# Patient Record
Sex: Female | Born: 1950 | Race: White | Hispanic: No | Smoking: Former smoker
Health system: Southern US, Community
[De-identification: ages and names within clinical notes are randomized; demographics above are authoritative.]

## PROBLEM LIST (undated history)

## (undated) DIAGNOSIS — C4492 Squamous cell carcinoma of skin, unspecified: Secondary | ICD-10-CM

## (undated) DIAGNOSIS — Z9889 Other specified postprocedural states: Secondary | ICD-10-CM

## (undated) DIAGNOSIS — R011 Cardiac murmur, unspecified: Secondary | ICD-10-CM

## (undated) DIAGNOSIS — R112 Nausea with vomiting, unspecified: Secondary | ICD-10-CM

## (undated) DIAGNOSIS — I5189 Other ill-defined heart diseases: Secondary | ICD-10-CM

## (undated) DIAGNOSIS — N950 Postmenopausal bleeding: Secondary | ICD-10-CM

## (undated) DIAGNOSIS — K219 Gastro-esophageal reflux disease without esophagitis: Secondary | ICD-10-CM

## (undated) DIAGNOSIS — Z7982 Long term (current) use of aspirin: Secondary | ICD-10-CM

## (undated) DIAGNOSIS — N8500 Endometrial hyperplasia, unspecified: Secondary | ICD-10-CM

## (undated) DIAGNOSIS — K7581 Nonalcoholic steatohepatitis (NASH): Secondary | ICD-10-CM

## (undated) DIAGNOSIS — E785 Hyperlipidemia, unspecified: Secondary | ICD-10-CM

## (undated) DIAGNOSIS — R0609 Other forms of dyspnea: Secondary | ICD-10-CM

## (undated) DIAGNOSIS — R0600 Dyspnea, unspecified: Secondary | ICD-10-CM

## (undated) DIAGNOSIS — F1290 Cannabis use, unspecified, uncomplicated: Secondary | ICD-10-CM

## (undated) DIAGNOSIS — B001 Herpesviral vesicular dermatitis: Secondary | ICD-10-CM

## (undated) DIAGNOSIS — T8859XA Other complications of anesthesia, initial encounter: Secondary | ICD-10-CM

## (undated) DIAGNOSIS — E669 Obesity, unspecified: Secondary | ICD-10-CM

## (undated) DIAGNOSIS — E119 Type 2 diabetes mellitus without complications: Secondary | ICD-10-CM

## (undated) DIAGNOSIS — I350 Nonrheumatic aortic (valve) stenosis: Secondary | ICD-10-CM

## (undated) DIAGNOSIS — M5030 Other cervical disc degeneration, unspecified cervical region: Secondary | ICD-10-CM

## (undated) DIAGNOSIS — K635 Polyp of colon: Secondary | ICD-10-CM

## (undated) DIAGNOSIS — C801 Malignant (primary) neoplasm, unspecified: Secondary | ICD-10-CM

## (undated) DIAGNOSIS — I10 Essential (primary) hypertension: Secondary | ICD-10-CM

## (undated) HISTORY — PX: COLONOSCOPY WITH PROPOFOL: SHX5780

## (undated) HISTORY — PX: CERVICAL FUSION: SHX112

## (undated) HISTORY — PX: TUBAL LIGATION: SHX77

## (undated) HISTORY — PX: MANDIBLE RECONSTRUCTION: SHX431

## (undated) HISTORY — PX: BREAST BIOPSY: SHX20

---

## 2004-02-12 ENCOUNTER — Ambulatory Visit: Payer: Self-pay | Admitting: Otolaryngology

## 2015-02-02 ENCOUNTER — Other Ambulatory Visit: Payer: Self-pay | Admitting: Pediatrics

## 2015-02-02 DIAGNOSIS — Z1231 Encounter for screening mammogram for malignant neoplasm of breast: Secondary | ICD-10-CM

## 2015-02-07 ENCOUNTER — Ambulatory Visit
Admission: RE | Admit: 2015-02-07 | Discharge: 2015-02-07 | Disposition: A | Discharge status: Home / Self Care | Payer: 59 | Source: Ambulatory Visit | Attending: Pediatrics | Admitting: Pediatrics

## 2015-02-07 DIAGNOSIS — Z1231 Encounter for screening mammogram for malignant neoplasm of breast: Secondary | ICD-10-CM

## 2016-02-12 ENCOUNTER — Other Ambulatory Visit: Payer: Self-pay | Admitting: Pediatrics

## 2016-02-12 DIAGNOSIS — Z1231 Encounter for screening mammogram for malignant neoplasm of breast: Secondary | ICD-10-CM

## 2016-02-13 ENCOUNTER — Ambulatory Visit
Admission: RE | Admit: 2016-02-13 | Discharge: 2016-02-13 | Disposition: A | Discharge status: Home / Self Care | Payer: 59 | Source: Ambulatory Visit | Attending: Pediatrics | Admitting: Pediatrics

## 2016-02-13 DIAGNOSIS — Z1231 Encounter for screening mammogram for malignant neoplasm of breast: Secondary | ICD-10-CM | POA: Insufficient documentation

## 2016-11-24 ENCOUNTER — Other Ambulatory Visit: Payer: Self-pay | Admitting: Pediatrics

## 2016-11-24 DIAGNOSIS — Z1231 Encounter for screening mammogram for malignant neoplasm of breast: Secondary | ICD-10-CM

## 2016-12-25 ENCOUNTER — Other Ambulatory Visit: Payer: Self-pay | Admitting: Pediatrics

## 2016-12-25 DIAGNOSIS — Z0000 Encounter for general adult medical examination without abnormal findings: Secondary | ICD-10-CM

## 2016-12-25 DIAGNOSIS — M899 Disorder of bone, unspecified: Secondary | ICD-10-CM

## 2017-02-19 ENCOUNTER — Ambulatory Visit
Admission: RE | Admit: 2017-02-19 | Discharge: 2017-02-19 | Disposition: A | Discharge status: Home / Self Care | Payer: Medicare Other | Source: Ambulatory Visit | Attending: Pediatrics | Admitting: Pediatrics

## 2017-02-19 DIAGNOSIS — Z1231 Encounter for screening mammogram for malignant neoplasm of breast: Secondary | ICD-10-CM | POA: Diagnosis not present

## 2018-01-18 ENCOUNTER — Other Ambulatory Visit: Payer: Self-pay | Admitting: Pediatrics

## 2018-01-18 DIAGNOSIS — Z1231 Encounter for screening mammogram for malignant neoplasm of breast: Secondary | ICD-10-CM

## 2018-02-25 ENCOUNTER — Ambulatory Visit
Admission: RE | Admit: 2018-02-25 | Discharge: 2018-02-25 | Disposition: A | Discharge status: Home / Self Care | Payer: Medicare Other | Source: Ambulatory Visit | Attending: Pediatrics | Admitting: Pediatrics

## 2018-02-25 DIAGNOSIS — Z1231 Encounter for screening mammogram for malignant neoplasm of breast: Secondary | ICD-10-CM | POA: Diagnosis not present

## 2018-04-14 ENCOUNTER — Other Ambulatory Visit: Payer: Self-pay | Admitting: Pediatrics

## 2018-04-14 DIAGNOSIS — Z780 Asymptomatic menopausal state: Secondary | ICD-10-CM

## 2018-06-10 ENCOUNTER — Other Ambulatory Visit: Payer: Medicare Other

## 2019-04-20 ENCOUNTER — Other Ambulatory Visit: Payer: Self-pay | Admitting: Pediatrics

## 2019-04-20 DIAGNOSIS — Z1231 Encounter for screening mammogram for malignant neoplasm of breast: Secondary | ICD-10-CM

## 2019-06-06 ENCOUNTER — Ambulatory Visit
Admission: RE | Admit: 2019-06-06 | Discharge: 2019-06-06 | Disposition: A | Discharge status: Home / Self Care | Payer: Medicare Other | Source: Ambulatory Visit | Attending: Pediatrics | Admitting: Pediatrics

## 2019-06-06 DIAGNOSIS — Z1231 Encounter for screening mammogram for malignant neoplasm of breast: Secondary | ICD-10-CM | POA: Diagnosis not present

## 2020-10-10 ENCOUNTER — Emergency Department
Admission: EM | Admit: 2020-10-10 | Discharge: 2020-10-10 | Disposition: A | Discharge status: Home / Self Care | Payer: Medicare Other | Attending: Emergency Medicine | Admitting: Emergency Medicine

## 2020-10-10 ENCOUNTER — Emergency Department: Payer: Medicare Other

## 2020-10-10 ENCOUNTER — Other Ambulatory Visit: Payer: Self-pay

## 2020-10-10 DIAGNOSIS — I10 Essential (primary) hypertension: Secondary | ICD-10-CM | POA: Insufficient documentation

## 2020-10-10 DIAGNOSIS — S0181XA Laceration without foreign body of other part of head, initial encounter: Secondary | ICD-10-CM

## 2020-10-10 DIAGNOSIS — Y9302 Activity, running: Secondary | ICD-10-CM | POA: Insufficient documentation

## 2020-10-10 DIAGNOSIS — S0990XA Unspecified injury of head, initial encounter: Secondary | ICD-10-CM

## 2020-10-10 DIAGNOSIS — Y92481 Parking lot as the place of occurrence of the external cause: Secondary | ICD-10-CM | POA: Insufficient documentation

## 2020-10-10 DIAGNOSIS — E119 Type 2 diabetes mellitus without complications: Secondary | ICD-10-CM | POA: Insufficient documentation

## 2020-10-10 DIAGNOSIS — W01198A Fall on same level from slipping, tripping and stumbling with subsequent striking against other object, initial encounter: Secondary | ICD-10-CM | POA: Insufficient documentation

## 2020-10-10 HISTORY — DX: Essential (primary) hypertension: I10

## 2020-10-10 HISTORY — DX: Type 2 diabetes mellitus without complications: E11.9

## 2020-10-10 MED ORDER — LIDOCAINE-EPINEPHRINE 2 %-1:100000 IJ SOLN
20.0000 mL | Freq: Once | INTRAMUSCULAR | Status: AC
  Administered 2020-10-10: 20 mL via INTRADERMAL

## 2020-10-10 NOTE — ED Triage Notes (Signed)
Pt to ED for fall after tripping while running after grandson who got loose in parking lot. Hit head on pavement. C/o "terrible" headache  Laceration noted to right forehead.  Denies blood thinner use.  Bleeding controlled.  Denies n/v

## 2020-10-10 NOTE — Discharge Instructions (Addendum)
Do not get the sutured area wet for 24 hours. After 24 hours, shower/bathe as usual and pat the area dry. Change the bandage 2 times per day and apply antibiotic ointment. Leave open to air when at no risk of getting the area dirty, but cover at night before bed. See your PCP or go to Urgent Care in 5 days for suture removal or sooner for signs or concern of infection.

## 2020-10-10 NOTE — ED Provider Notes (Signed)
[facility] Emergency Department Provider Note ____________________________________________   Event Date/Time   First MD Initiated Contact with Patient 10/10/20 2024     (approximate)  I have reviewed the triage vital signs and the nursing notes.   HISTORY  Chief Complaint Fall  HPI Courtney Sparks is a 70 y.o. female with history of diabetes and hypertension presents to the emergency department for treatment and evaluation of head injury and forehead laceration after mechanical, nonsyncopal fall.  [place] and her 23-year-old nephew pulled away from her and started running through the parking lot.  She took off running and tripped.  She fell hard onto the pavement.  She denies loss of consciousness.  Forehead laceration noted.  Bleeding is well controlled.  Tdap booster is current..         Past Medical History:  Diagnosis Date   Diabetes mellitus without complication ([place])    Hypertension     There are no problems to display for this patient.   Past Surgical History:  Procedure Laterality Date   BREAST BIOPSY Left 2010?   core bx-neg    Prior to Admission medications   Not on File    Allergies Patient has no allergy information on record.  Family History  Problem Relation Age of Onset   Breast cancer Neg Hx     Social History    Review of Systems  Constitutional: No fever/chills Eyes: No visual changes. ENT: No sore throat. Cardiovascular: Denies chest pain. Respiratory: Denies shortness of breath. Gastrointestinal: No abdominal pain.  No nausea, no vomiting.  No diarrhea.  No constipation. Genitourinary: Negative for dysuria. Musculoskeletal: Negative for back pain. Skin: Positive for laceration Neurological: Positive for headaches,  negative focal weakness or numbness. ____________________________________________   PHYSICAL EXAM:  VITAL SIGNS: ED Triage Vitals  Enc Vitals Group      BP 10/10/20 1749 (!) 190/78     Pulse Rate 10/10/20 1749 77     Resp 10/10/20 1749 20     Temp 10/10/20 1749 98.3 F (36.8 C)     Temp Source 10/10/20 1749 Oral     SpO2 10/10/20 1749 100 %     Weight 10/10/20 1750 226 lb (102.5 kg)     Height 10/10/20 1750 '5\' 9"'$  (1.753 m)     Head Circumference --      Peak Flow --      Pain Score 10/10/20 1755 9     Pain Loc --      Pain Edu? --      Excl. in [place]? --     Constitutional: Alert and oriented. Well appearing and in no acute distress. Eyes: Conjunctivae are normal. PERRL. EOMI. Head: Atraumatic. Nose: No congestion/rhinnorhea. Mouth/Throat: Mucous membranes are moist.  Oropharynx non-erythematous. Neck: No stridor.   Hematological/Lymphatic/Immunilogical: No cervical lymphadenopathy. Cardiovascular: Normal rate, regular rhythm. Grossly normal heart sounds.  Good peripheral circulation. Respiratory: Normal respiratory effort.  No retractions. Lungs CTAB. Gastrointestinal: Soft and nontender.  Genitourinary:  Musculoskeletal: Demonstrates full range of motion of extremities.   Neurologic:  Normal speech and language. No gross focal neurologic deficits are appreciated. No gait instability. Skin: 6 cm laceration noted to the right side of the forehead Psychiatric: Mood and affect are normal. Speech and behavior are normal.  ____________________________________________   LABS (all labs ordered are listed, but only abnormal results are displayed)  Labs Reviewed - No data to display ____________________________________________  EKG  Not indicated ____________________________________________  RADIOLOGY  ED MD interpretation:    CT of the head, cervical spine, maxillofacial bones are all negative for acute concerns per radiology.  I, Sherrie George, personally viewed and evaluated these images (plain radiographs) as part of my medical decision making, as well as reviewing the written report by the radiologist.  Official  radiology report(s): CT HEAD WO CONTRAST (5MM)  Result Date: 10/10/2020 CLINICAL DATA:  [place] with trauma to the head and face. EXAM: CT HEAD WITHOUT CONTRAST CT MAXILLOFACIAL WITHOUT CONTRAST CT CERVICAL SPINE WITHOUT CONTRAST TECHNIQUE: Multidetector CT imaging of the head, cervical spine, and maxillofacial structures were performed using the standard protocol without intravenous contrast. Multiplanar CT image reconstructions of the cervical spine and maxillofacial structures were also generated. COMPARISON:  None. FINDINGS: CT HEAD FINDINGS Brain: Normal without evidence of old or acute infarction, mass lesion, hemorrhage, hydrocephalus or extra-axial collection. Vascular: No abnormal vascular finding. Skull: No skull fracture. Other: Mild soft tissue swelling of the right forehead. CT MAXILLOFACIAL FINDINGS Osseous: No facial fracture. Orbits: No soft tissue injury orbital pathology. Sinuses: Sinuses are clear. Soft tissues: Soft tissue swelling of the right forehead. CT CERVICAL SPINE FINDINGS Alignment: No malalignment. Skull base and vertebrae: No regional fracture or focal bone lesion. Soft tissues and spinal canal: No soft tissue neck abnormality seen. Disc levels: Foramen magnum widely patent. Ordinary osteoarthritis at the C1-2 articulation but without encroachment upon the neural structures. C2 through C4: Chronic facet fusion. Wide patency of the canal and foramina. C4-5: Chronic facet osteoarthritis on the left. Mild left foraminal narrowing. C5-6: Bilateral uncovertebral osteophytes. Mild bilateral foraminal narrowing. C6-7: Mild facet osteoarthritis.  No compressive narrowing. C7-T1: Facet osteoarthritis on the left.  No stenosis. Upper chest: Negative Other: None IMPRESSION: Head CT: No skull fracture. No intracranial abnormality. Mild soft tissue swelling of the forehead. Maxillofacial CT: No facial fracture.  No fluid in the sinuses. Cervical spine CT: No cervical spine fracture. No  malalignment. Chronic fusion of the spine from C2 through C4. Mild degenerative changes below that. Electronically Signed   By: Nelson Chimes M.D.   On: 10/10/2020 18:36   CT Cervical Spine Wo Contrast  Result Date: 10/10/2020 CLINICAL DATA:  [place] with trauma to the head and face. EXAM: CT HEAD WITHOUT CONTRAST CT MAXILLOFACIAL WITHOUT CONTRAST CT CERVICAL SPINE WITHOUT CONTRAST TECHNIQUE: Multidetector CT imaging of the head, cervical spine, and maxillofacial structures were performed using the standard protocol without intravenous contrast. Multiplanar CT image reconstructions of the cervical spine and maxillofacial structures were also generated. COMPARISON:  None. FINDINGS: CT HEAD FINDINGS Brain: Normal without evidence of old or acute infarction, mass lesion, hemorrhage, hydrocephalus or extra-axial collection. Vascular: No abnormal vascular finding. Skull: No skull fracture. Other: Mild soft tissue swelling of the right forehead. CT MAXILLOFACIAL FINDINGS Osseous: No facial fracture. Orbits: No soft tissue injury orbital pathology. Sinuses: Sinuses are clear. Soft tissues: Soft tissue swelling of the right forehead. CT CERVICAL SPINE FINDINGS Alignment: No malalignment. Skull base and vertebrae: No regional fracture or focal bone lesion. Soft tissues and spinal canal: No soft tissue neck abnormality seen. Disc levels: Foramen magnum widely patent. Ordinary osteoarthritis at the C1-2 articulation but without encroachment upon the neural structures. C2 through C4: Chronic facet fusion. Wide patency of the canal and foramina. C4-5: Chronic facet osteoarthritis on the left. Mild left foraminal narrowing. C5-6: Bilateral uncovertebral osteophytes. Mild bilateral foraminal narrowing. C6-7: Mild facet osteoarthritis.  No compressive narrowing. C7-T1: Facet osteoarthritis on the left.  No stenosis. Upper chest: Negative Other: None IMPRESSION: Head CT: No skull fracture. No intracranial abnormality. Mild soft  tissue swelling of the forehead. Maxillofacial CT: No facial fracture.  No fluid in the sinuses. Cervical spine CT: No cervical spine fracture. No malalignment. Chronic fusion of the spine from C2 through C4. Mild degenerative changes below that. Electronically Signed   By: Nelson Chimes M.D.   On: 10/10/2020 18:36   CT MAXILLOFACIAL WO CONTRAST  Result Date: 10/10/2020 CLINICAL DATA:  [place] with trauma to the head and face. EXAM: CT HEAD WITHOUT CONTRAST CT MAXILLOFACIAL WITHOUT CONTRAST CT CERVICAL SPINE WITHOUT CONTRAST TECHNIQUE: Multidetector CT imaging of the head, cervical spine, and maxillofacial structures were performed using the standard protocol without intravenous contrast. Multiplanar CT image reconstructions of the cervical spine and maxillofacial structures were also generated. COMPARISON:  None. FINDINGS: CT HEAD FINDINGS Brain: Normal without evidence of old or acute infarction, mass lesion, hemorrhage, hydrocephalus or extra-axial collection. Vascular: No abnormal vascular finding. Skull: No skull fracture. Other: Mild soft tissue swelling of the right forehead. CT MAXILLOFACIAL FINDINGS Osseous: No facial fracture. Orbits: No soft tissue injury orbital pathology. Sinuses: Sinuses are clear. Soft tissues: Soft tissue swelling of the right forehead. CT CERVICAL SPINE FINDINGS Alignment: No malalignment. Skull base and vertebrae: No regional fracture or focal bone lesion. Soft tissues and spinal canal: No soft tissue neck abnormality seen. Disc levels: Foramen magnum widely patent. Ordinary osteoarthritis at the C1-2 articulation but without encroachment upon the neural structures. C2 through C4: Chronic facet fusion. Wide patency of the canal and foramina. C4-5: Chronic facet osteoarthritis on the left. Mild left foraminal narrowing. C5-6: Bilateral uncovertebral osteophytes. Mild bilateral foraminal narrowing. C6-7: Mild facet osteoarthritis.  No compressive narrowing. C7-T1: Facet  osteoarthritis on the left.  No stenosis. Upper chest: Negative Other: None IMPRESSION: Head CT: No skull fracture. No intracranial abnormality. Mild soft tissue swelling of the forehead. Maxillofacial CT: No facial fracture.  No fluid in the sinuses. Cervical spine CT: No cervical spine fracture. No malalignment. Chronic fusion of the spine from C2 through C4. Mild degenerative changes below that. Electronically Signed   By: Nelson Chimes M.D.   On: 10/10/2020 18:36    ____________________________________________   PROCEDURES  Procedure(s) performed (including Critical Care):  Marland KitchenMarland KitchenLaceration Repair  Date/Time: 10/10/2020 9:24 PM Performed by: Victorino Dike, FNP Authorized by: Victorino Dike, FNP   Consent:    Consent obtained:  Verbal   Consent given by:  Patient   Risks discussed:  Poor cosmetic result and poor wound healing Universal protocol:    Patient identity confirmed:  Verbally with patient Anesthesia:    Anesthesia method:  Local infiltration   Local anesthetic:  Lidocaine 2% WITH epi Laceration details:    Location:  Face   Face location:  Forehead   Length (cm):  5.5 Pre-procedure details:    Preparation:  Patient was prepped and draped in usual sterile fashion and imaging obtained to evaluate for foreign bodies Exploration:    Hemostasis achieved with:  Epinephrine   Wound extent: no muscle damage noted   Treatment:    Area cleansed with:  Povidone-iodine and saline Skin repair:    Repair method:  Sutures   Suture size:  6-0   Suture material:  Prolene   Suture technique:  Simple interrupted   Number of sutures:  6 Approximation:    Approximation:  Close Repair type:    Repair type:  Simple Post-procedure details:    Dressing:  Antibiotic ointment   Procedure completion:  Tolerated well, no immediate complications Comments:     3cm of the wound did not require repair--deep abrasion instead of full thickness  laceration.  ____________________________________________   INITIAL IMPRESSION / ASSESSMENT AND PLAN     70 year old female presenting to the emergency department for treatment and evaluation of head injury and forehead laceration after a mechanical, nonsyncopal fall.  DIFFERENTIAL DIAGNOSIS  Intracranial abnormality, concussion, laceration  ED COURSE  CT head, cervical spine, and maxillofacial bones are all negative for acute concerns per radiology.  Forehead laceration was repaired as described above.  Wound care discussed with the patient.  She will also be provided with some head injury instructions and encouraged to return to the emergency department immediately if she begins to have symptoms of concern.  Clinical Course as of 10/10/20 2135  Wed Oct 10, 2020  2025 CT HEAD WO CONTRAST (5MM) [CT]    Clinical Course User Index [CT] Luis Sami, Dessa Phi, FNP   ___________________________________________   FINAL CLINICAL IMPRESSION(S) / ED DIAGNOSES  Final diagnoses:  Minor head injury, initial encounter  Forehead laceration, initial encounter     ED Discharge Orders     None        Audi Higuchi was evaluated in Emergency Department on 10/10/2020 for the symptoms described in the history of present illness. She was evaluated in the context of the global COVID-19 pandemic, which necessitated consideration that the patient might be at risk for infection with the SARS-CoV-2 virus that causes COVID-19. Institutional protocols and algorithms that pertain to the evaluation of patients at risk for COVID-19 are in a state of rapid change based on information released by regulatory bodies including the CDC and federal and state organizations. These policies and algorithms were followed during the patient's care in the ED.   Note:  This document was prepared using Dragon voice recognition software and may include unintentional dictation errors.    Victorino Dike, FNP 10/10/20  2136    Delman Kitten, MD 10/16/20 1106

## 2020-11-06 ENCOUNTER — Other Ambulatory Visit: Payer: Self-pay | Admitting: Pediatrics

## 2020-11-06 DIAGNOSIS — Z780 Asymptomatic menopausal state: Secondary | ICD-10-CM

## 2021-01-15 ENCOUNTER — Other Ambulatory Visit: Payer: Self-pay | Admitting: Gastroenterology

## 2021-01-15 DIAGNOSIS — K7581 Nonalcoholic steatohepatitis (NASH): Secondary | ICD-10-CM

## 2021-02-07 ENCOUNTER — Other Ambulatory Visit: Payer: Self-pay | Admitting: Pediatrics

## 2021-02-07 DIAGNOSIS — Z1231 Encounter for screening mammogram for malignant neoplasm of breast: Secondary | ICD-10-CM

## 2021-03-28 ENCOUNTER — Ambulatory Visit
Admission: RE | Admit: 2021-03-28 | Discharge: 2021-03-28 | Disposition: A | Discharge status: Home / Self Care | Payer: Medicare Other | Source: Ambulatory Visit | Attending: Pediatrics | Admitting: Pediatrics

## 2021-03-28 ENCOUNTER — Other Ambulatory Visit: Payer: Self-pay

## 2021-03-28 DIAGNOSIS — Z1231 Encounter for screening mammogram for malignant neoplasm of breast: Secondary | ICD-10-CM

## 2021-03-28 DIAGNOSIS — Z780 Asymptomatic menopausal state: Secondary | ICD-10-CM | POA: Insufficient documentation

## 2021-04-05 ENCOUNTER — Encounter: Payer: Self-pay | Admitting: *Deleted

## 2021-04-08 ENCOUNTER — Ambulatory Visit: Payer: Medicare Other | Admitting: Certified Registered Nurse Anesthetist

## 2021-04-08 ENCOUNTER — Encounter
Admission: RE | Disposition: A | Discharge status: Home / Self Care | Payer: Self-pay | Source: Home / Self Care | Attending: Gastroenterology

## 2021-04-08 ENCOUNTER — Encounter: Payer: Self-pay | Admitting: *Deleted

## 2021-04-08 ENCOUNTER — Other Ambulatory Visit: Payer: Self-pay

## 2021-04-08 ENCOUNTER — Ambulatory Visit
Admission: RE | Admit: 2021-04-08 | Discharge: 2021-04-08 | Disposition: A | Discharge status: Home / Self Care | Payer: Medicare Other | Attending: Gastroenterology | Admitting: Gastroenterology

## 2021-04-08 DIAGNOSIS — D120 Benign neoplasm of cecum: Secondary | ICD-10-CM | POA: Insufficient documentation

## 2021-04-08 DIAGNOSIS — K640 First degree hemorrhoids: Secondary | ICD-10-CM | POA: Insufficient documentation

## 2021-04-08 DIAGNOSIS — I10 Essential (primary) hypertension: Secondary | ICD-10-CM | POA: Insufficient documentation

## 2021-04-08 DIAGNOSIS — K6389 Other specified diseases of intestine: Secondary | ICD-10-CM | POA: Insufficient documentation

## 2021-04-08 DIAGNOSIS — K7581 Nonalcoholic steatohepatitis (NASH): Secondary | ICD-10-CM | POA: Insufficient documentation

## 2021-04-08 DIAGNOSIS — E119 Type 2 diabetes mellitus without complications: Secondary | ICD-10-CM | POA: Insufficient documentation

## 2021-04-08 DIAGNOSIS — K5730 Diverticulosis of large intestine without perforation or abscess without bleeding: Secondary | ICD-10-CM | POA: Diagnosis not present

## 2021-04-08 DIAGNOSIS — Z86010 Personal history of colonic polyps: Secondary | ICD-10-CM | POA: Diagnosis not present

## 2021-04-08 DIAGNOSIS — D123 Benign neoplasm of transverse colon: Secondary | ICD-10-CM | POA: Insufficient documentation

## 2021-04-08 DIAGNOSIS — Z9851 Tubal ligation status: Secondary | ICD-10-CM | POA: Insufficient documentation

## 2021-04-08 DIAGNOSIS — K5520 Angiodysplasia of colon without hemorrhage: Secondary | ICD-10-CM | POA: Insufficient documentation

## 2021-04-08 DIAGNOSIS — Z1211 Encounter for screening for malignant neoplasm of colon: Secondary | ICD-10-CM | POA: Insufficient documentation

## 2021-04-08 HISTORY — PX: COLONOSCOPY: SHX5424

## 2021-04-08 HISTORY — DX: Malignant (primary) neoplasm, unspecified: C80.1

## 2021-04-08 HISTORY — DX: Herpesviral vesicular dermatitis: B00.1

## 2021-04-08 HISTORY — DX: Nonalcoholic steatohepatitis (NASH): K75.81

## 2021-04-08 SURGERY — COLONOSCOPY
Anesthesia: General

## 2021-04-08 MED ORDER — PROPOFOL 500 MG/50ML IV EMUL
INTRAVENOUS | Status: DC | PRN
  Administered 2021-04-08: 150 ug/kg/min via INTRAVENOUS

## 2021-04-08 MED ORDER — ONDANSETRON HCL 4 MG/2ML IJ SOLN
INTRAMUSCULAR | Status: DC | PRN
  Administered 2021-04-08: 4 mg via INTRAVENOUS

## 2021-04-08 MED ORDER — ONDANSETRON HCL 4 MG/2ML IJ SOLN
INTRAMUSCULAR | Status: AC
  Filled 2021-04-08: qty 2

## 2021-04-08 MED ORDER — PROPOFOL 10 MG/ML IV BOLUS
INTRAVENOUS | Status: DC | PRN
  Administered 2021-04-08: 20 mg via INTRAVENOUS
  Administered 2021-04-08: 80 mg via INTRAVENOUS

## 2021-04-08 MED ORDER — DEXMEDETOMIDINE (PRECEDEX) IN NS 20 MCG/5ML (4 MCG/ML) IV SYRINGE
PREFILLED_SYRINGE | INTRAVENOUS | Status: DC | PRN
  Administered 2021-04-08: 8 ug via INTRAVENOUS

## 2021-04-08 MED ORDER — SODIUM CHLORIDE 0.9 % IV SOLN: INTRAVENOUS | Status: DC

## 2021-04-08 MED ORDER — LIDOCAINE HCL (PF) 2 % IJ SOLN
INTRAMUSCULAR | Status: AC
  Filled 2021-04-08: qty 5

## 2021-04-08 MED ORDER — LIDOCAINE HCL (CARDIAC) PF 100 MG/5ML IV SOSY
PREFILLED_SYRINGE | INTRAVENOUS | Status: DC | PRN
  Administered 2021-04-08: 50 mg via INTRAVENOUS

## 2021-04-08 MED ORDER — PROPOFOL 500 MG/50ML IV EMUL
INTRAVENOUS | Status: AC
  Filled 2021-04-08: qty 50

## 2021-04-08 NOTE — Anesthesia Preprocedure Evaluation (Signed)
Anesthesia Evaluation  Patient identified by MRN, date of birth, ID band Patient awake    Reviewed: Allergy & Precautions, NPO status , Patient's Chart, lab work & pertinent test results  History of Anesthesia Complications (+) PONV and history of anesthetic complications  Airway Mallampati: II  TM Distance: >3 FB Neck ROM: Full    Dental no notable dental hx. (+) Teeth Intact   Pulmonary neg pulmonary ROS, neg sleep apnea, neg COPD, Patient abstained from smoking.Not current smoker, former smoker,    Pulmonary exam normal breath sounds clear to auscultation       Cardiovascular Exercise Tolerance: Good METShypertension, (-) CAD and (-) Past MI (-) dysrhythmias  Rhythm:Regular Rate:Normal - Systolic murmurs    Neuro/Psych negative neurological ROS  negative psych ROS   GI/Hepatic neg GERD  ,(+)     (-) substance abuse  , Hepatitis -  Endo/Other  diabetes  Renal/GU negative Renal ROS     Musculoskeletal   Abdominal   Peds  Hematology   Anesthesia Other Findings Past Medical History: No date: Cancer ([place]) No date: Diabetes mellitus without complication ([facility]) 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 No date: Hypertension No date: NASH (nonalcoholic steatohepatitis)  Reproductive/Obstetrics                             Anesthesia Physical Anesthesia Plan  ASA: 2  Anesthesia Plan: General   Post-op Pain Management: Minimal or no pain anticipated   Induction: Intravenous  PONV Risk Score and Plan: 4 or greater and Propofol infusion, TIVA and Ondansetron  Airway Management Planned: Nasal Cannula  Additional Equipment: None  Intra-op Plan:   Post-operative Plan:   Informed Consent: I have reviewed the patients History and Physical, chart, labs and discussed the procedure including the risks, benefits and alternatives for the proposed anesthesia with the patient or authorized representative who  has indicated his/her understanding and acceptance.     Dental advisory given  Plan Discussed with: CRNA and Surgeon  Anesthesia Plan Comments: (Discussed risks of anesthesia with patient, including possibility of difficulty with spontaneous ventilation under anesthesia necessitating airway intervention, PONV, and rare risks such as cardiac or respiratory or neurological events, and allergic reactions. Discussed the role of CRNA in patient's perioperative care. Patient understands.)        Anesthesia Quick Evaluation

## 2021-04-08 NOTE — Interval H&P Note (Signed)
History and Physical Interval Note:  04/08/2021 10:25 AM  Courtney Sparks  has presented today for surgery, with the diagnosis of HX OF ADENOMATOUS POLPY OF COLON.  The various methods of treatment have been discussed with the patient and family. After consideration of risks, benefits and other options for treatment, the patient has consented to  Procedure(s) with comments: COLONOSCOPY (N/A) - DM as a surgical intervention.  The patient's history has been reviewed, patient examined, no change in status, stable for surgery.  I have reviewed the patient's chart and labs.  Questions were answered to the patient's satisfaction.     Lesly Rubenstein  Ok to proceed with colonoscopy

## 2021-04-08 NOTE — Transfer of Care (Signed)
Immediate Anesthesia Transfer of Care Note  Patient: Courtney Sparks  Procedure(s) Performed: COLONOSCOPY  Patient Location: PACU  Anesthesia Type:General  Level of Consciousness: sedated  Airway & Oxygen Therapy: Patient Spontanous Breathing and Patient connected to nasal cannula oxygen  Post-op Assessment: Report given to RN and Post -op Vital signs reviewed and stable  Post vital signs: Reviewed and stable  Last Vitals:  Vitals Value Taken Time  BP 98/55 04/08/21 1112  Temp    Pulse 65 04/08/21 1112  Resp 21 04/08/21 1112  SpO2 95 % 04/08/21 1112  Vitals shown include unvalidated device data.  Last Pain:  Vitals:   04/08/21 1009  TempSrc: Temporal  PainSc: 0-No pain         Complications: No notable events documented.

## 2021-04-08 NOTE — Op Note (Signed)
[facility] Patient Name: Courtney Sparks [facility] Procedure Date: 04/08/2021 10:17 AM MRN: [id number] Account #: [id number] Date of Birth: 1950/08/29 Admit Type: Outpatient Age: 71 Room: [facility] ENDO ROOM 3 Gender: Female Note Status: Finalized Instrument Name: Jasper Riling [id number] Procedure:             Colonoscopy Indications:           Surveillance: Personal history of adenomatous polyps                         on last colonoscopy > 5 years ago Providers:             Andrey Farmer MD, MD Medicines:             Monitored Anesthesia Care Complications:         No immediate complications. Estimated blood loss:                         Minimal. Procedure:             Pre-Anesthesia Assessment:                        - Prior to the procedure, a History and Physical was                         performed, and patient medications and allergies were                         reviewed. The patient is competent. The risks and                         benefits of the procedure and the sedation options and                         risks were discussed with the patient. All questions                         were answered and informed consent was obtained.                         Patient identification and proposed procedure were                         verified by the physician, the nurse, the                         anesthesiologist, the anesthetist and the technician                         in the endoscopy suite. Mental Status Examination:                         alert and oriented. Airway Examination: normal                         oropharyngeal airway and neck mobility. Respiratory                         Examination: clear to auscultation. CV Examination:  normal. Prophylactic Antibiotics: The patient does not                         require prophylactic antibiotics. Prior                         Anticoagulants: The patient has taken no previous                          anticoagulant or antiplatelet agents. ASA Grade                         Assessment: III - A patient with severe systemic                         disease. After reviewing the risks and benefits, the                         patient was deemed in satisfactory condition to                         undergo the procedure. The anesthesia plan was to use                         monitored anesthesia care (MAC). Immediately prior to                         administration of medications, the patient was                         re-assessed for adequacy to receive sedatives. The                         heart rate, respiratory rate, oxygen saturations,                         blood pressure, adequacy of pulmonary ventilation, and                         response to care were monitored throughout the                         procedure. The physical status of the patient was                         re-assessed after the procedure.                        After obtaining informed consent, the colonoscope was                         passed under direct vision. Throughout the procedure,                         the patient's blood pressure, pulse, and oxygen                         saturations were monitored continuously. The  Colonoscope was introduced through the anus and                         advanced to the the terminal ileum. The colonoscopy                         was somewhat difficult due to restricted mobility of                         the colon and a tortuous colon. Successful completion                         of the procedure was aided by applying abdominal                         pressure. The patient tolerated the procedure well.                         The quality of the bowel preparation was fair. Findings:      The perianal and digital rectal examinations were normal.      The terminal ileum appeared normal.      A single large localized  angioectasia without bleeding was found at the       ileocecal valve.      A 3 mm polyp was found in the cecum. The polyp was sessile. The polyp       was removed with a cold snare. Resection and retrieval were complete.       Estimated blood loss was minimal.      A 1 mm polyp was found in the cecum. The polyp was sessile. The polyp       was removed with a jumbo cold forceps. Resection and retrieval were       complete. Estimated blood loss was minimal.      A diffuse area of granular mucosa was found at the appendiceal orifice.       Biopsies were taken with a cold forceps for histology. Estimated blood       loss was minimal.      A 5 mm polyp was found in the transverse colon. The polyp was sessile.       The polyp was removed with a cold snare. Resection and retrieval were       complete. Estimated blood loss was minimal.      A few small and large-mouthed diverticula were found in the transverse       colon, hepatic flexure and ascending colon.      Many small and large-mouthed diverticula were found in the sigmoid colon       and descending colon.      Internal hemorrhoids were found during retroflexion. The hemorrhoids       were Grade I (internal hemorrhoids that do not prolapse).      The exam was otherwise without abnormality on direct and retroflexion       views. Impression:            - Preparation of the colon was fair.                        - The examined portion of the ileum was normal.                        -  A single non-bleeding colonic angioectasia.                        - One 3 mm polyp in the cecum, removed with a cold                         snare. Resected and retrieved.                        - One 1 mm polyp in the cecum, removed with a jumbo                         cold forceps. Resected and retrieved.                        - Granularity at the appendiceal orifice. Biopsied.                        - One 5 mm polyp in the transverse colon, removed with                          a cold snare. Resected and retrieved.                        - Diverticulosis in the transverse colon, at the                         hepatic flexure and in the ascending colon.                        - Diverticulosis in the sigmoid colon and in the                         descending colon.                        - Internal hemorrhoids.                        - The examination was otherwise normal on direct and                         retroflexion views. Recommendation:        - Discharge patient to home.                        - Resume previous diet.                        - Continue present medications.                        - Await pathology results.                        - Repeat colonoscopy in 1-2 years because the bowel                         preparation was suboptimal.                        -  Return to referring physician as previously                         scheduled. Procedure Code(s):     --- Professional ---                        [phone], Colonoscopy, flexible; with removal of                         tumor(s), polyp(s), or other lesion(s) by snare                         technique                        [id number], 67, Colonoscopy, flexible; with biopsy, single                         or multiple Diagnosis Code(s):     --- Professional ---                        Z86.010, Personal history of colonic polyps                        K64.0, First degree hemorrhoids                        K55.20, Angiodysplasia of colon without hemorrhage                        K63.5, Polyp of colon                        K63.89, Other specified diseases of intestine                        K57.30, Diverticulosis of large intestine without                         perforation or abscess without bleeding CPT copyright 2019 American Medical Association. All rights reserved. The codes documented in this report are preliminary and upon coder review may  be revised to meet current  compliance requirements. Andrey Farmer MD, MD 04/08/2021 11:17:47 AM Number of Addenda: 0 Note Initiated On: 04/08/2021 10:17 AM Scope Withdrawal Time: 0 hours 14 minutes 26 seconds  Total Procedure Duration: 0 hours 33 minutes 1 second  Estimated Blood Loss:  Estimated blood loss was minimal.      [facility]

## 2021-04-08 NOTE — Anesthesia Procedure Notes (Signed)
Date/Time: 04/08/2021 10:27 AM Performed by: Johnna Acosta, CRNA Pre-anesthesia Checklist: Patient identified, Emergency Drugs available, Suction available, Patient being monitored and Timeout performed Patient Re-evaluated:Patient Re-evaluated prior to induction Oxygen Delivery Method: Nasal cannula Preoxygenation: Pre-oxygenation with 100% oxygen

## 2021-04-08 NOTE — H&P (Signed)
Outpatient short stay form Pre-procedure 04/08/2021  Lesly Rubenstein, MD  Primary Physician: Barbaraann Boys, MD  Reason for visit:  Surveillance colonoscopy  History of present illness:    71 y/o lady with history of NASH, hypertension, and DM II here for surveillance colonoscopy. Last colonoscopy was in 2017 with 14 mm TA removed from cecum. No family history of colon cancer. No blood thinners but platelets are 120. History of tubal ligation. No new symptoms.    Current Facility-Administered Medications:    0.9 %  sodium chloride infusion, , Intravenous, Continuous, Ailin Rochford, Hilton Cork, MD, Last Rate: 20 mL/hr at 04/08/21 1023, New Bag at 04/08/21 1023  Medications Prior to Admission  Medication Sig Dispense Refill Last Dose   aspirin EC 81 MG tablet Take 81 mg by mouth daily. Swallow whole.      atorvastatin (LIPITOR) 10 MG tablet Take 10 mg by mouth daily.   Past Week   calcium carbonate (TUMS - DOSED IN MG ELEMENTAL CALCIUM) 500 MG chewable tablet Chew 1 tablet by mouth daily.      lisinopril (ZESTRIL) 20 MG tablet Take 20 mg by mouth daily.   04/08/2021 at 0330   metFORMIN (GLUCOPHAGE) 500 MG tablet Take by mouth 2 (two) times daily with a meal.   04/05/2021   Multiple Vitamin (MULTIVITAMIN ADULT PO) Take by mouth.   04/05/2021   tretinoin (RETIN-A) 0.05 % cream Apply topically at bedtime.        No Known Allergies   Past Medical History:  Diagnosis Date   Cancer ([place])    Diabetes mellitus without complication ([place])    Herpes labialis    Hypertension    NASH (nonalcoholic steatohepatitis)     Review of systems:  Otherwise negative.    Physical Exam  Gen: Alert, oriented. Appears stated age.  HEENT: PERRLA. Lungs: No respiratory distress CV: RRR Abd: soft, benign, no masses Ext: No edema    Planned procedures: Proceed with colonoscopy. The patient understands the nature of the planned procedure, indications, risks, alternatives and potential complications  including but not limited to bleeding, infection, perforation, damage to internal organs and possible oversedation/side effects from anesthesia. The patient agrees and gives consent to proceed.  Please refer to procedure notes for findings, recommendations and patient disposition/instructions.     Lesly Rubenstein, MD [facility]

## 2021-04-08 NOTE — Anesthesia Postprocedure Evaluation (Signed)
Anesthesia Post Note  Patient: Courtney Sparks  Procedure(s) Performed: COLONOSCOPY  Patient location during evaluation: Endoscopy Anesthesia Type: General Level of consciousness: awake and alert Pain management: pain level controlled Vital Signs Assessment: post-procedure vital signs reviewed and stable Respiratory status: spontaneous breathing, nonlabored ventilation, respiratory function stable and patient connected to nasal cannula oxygen Cardiovascular status: blood pressure returned to baseline and stable Postop Assessment: no apparent nausea or vomiting Anesthetic complications: no   No notable events documented.   Last Vitals:  Vitals:   04/08/21 1130 04/08/21 1140  BP: (!) 145/76 (!) 142/70  Pulse: 65 63  Resp: 15 20  Temp:    SpO2: 98% 99%    Last Pain:  Vitals:   04/08/21 1140  TempSrc:   PainSc: 0-No pain                 Arita Miss

## 2021-04-09 ENCOUNTER — Encounter: Payer: Self-pay | Admitting: Gastroenterology

## 2021-04-09 LAB — SURGICAL PATHOLOGY

## 2022-03-25 ENCOUNTER — Other Ambulatory Visit: Payer: Self-pay | Admitting: Pediatrics

## 2022-03-25 DIAGNOSIS — Z1231 Encounter for screening mammogram for malignant neoplasm of breast: Secondary | ICD-10-CM

## 2022-03-31 ENCOUNTER — Ambulatory Visit
Admission: RE | Admit: 2022-03-31 | Discharge: 2022-03-31 | Disposition: A | Discharge status: Home / Self Care | Payer: Medicare HMO | Source: Ambulatory Visit | Attending: Pediatrics | Admitting: Pediatrics

## 2022-03-31 DIAGNOSIS — Z1231 Encounter for screening mammogram for malignant neoplasm of breast: Secondary | ICD-10-CM | POA: Diagnosis present

## 2022-07-16 ENCOUNTER — Other Ambulatory Visit: Payer: Self-pay

## 2022-07-16 ENCOUNTER — Other Ambulatory Visit: Payer: Self-pay | Admitting: Gastroenterology

## 2022-07-16 DIAGNOSIS — K760 Fatty (change of) liver, not elsewhere classified: Secondary | ICD-10-CM

## 2022-10-03 ENCOUNTER — Encounter: Payer: Self-pay | Admitting: *Deleted

## 2022-10-13 ENCOUNTER — Ambulatory Visit
Admission: RE | Admit: 2022-10-13 | Discharge: 2022-10-13 | Disposition: A | Discharge status: Home / Self Care | Payer: Medicare HMO | Source: Ambulatory Visit | Attending: Gastroenterology | Admitting: Gastroenterology

## 2022-10-13 ENCOUNTER — Encounter
Admission: RE | Disposition: A | Discharge status: Home / Self Care | Payer: Self-pay | Source: Ambulatory Visit | Attending: Gastroenterology

## 2022-10-13 ENCOUNTER — Ambulatory Visit: Payer: Medicare HMO | Admitting: Anesthesiology

## 2022-10-13 ENCOUNTER — Encounter: Payer: Self-pay | Admitting: *Deleted

## 2022-10-13 DIAGNOSIS — I10 Essential (primary) hypertension: Secondary | ICD-10-CM | POA: Diagnosis not present

## 2022-10-13 DIAGNOSIS — E119 Type 2 diabetes mellitus without complications: Secondary | ICD-10-CM | POA: Insufficient documentation

## 2022-10-13 DIAGNOSIS — Z09 Encounter for follow-up examination after completed treatment for conditions other than malignant neoplasm: Secondary | ICD-10-CM | POA: Insufficient documentation

## 2022-10-13 DIAGNOSIS — D122 Benign neoplasm of ascending colon: Secondary | ICD-10-CM | POA: Diagnosis not present

## 2022-10-13 DIAGNOSIS — K7581 Nonalcoholic steatohepatitis (NASH): Secondary | ICD-10-CM | POA: Diagnosis not present

## 2022-10-13 DIAGNOSIS — Z9851 Tubal ligation status: Secondary | ICD-10-CM | POA: Diagnosis not present

## 2022-10-13 DIAGNOSIS — K5730 Diverticulosis of large intestine without perforation or abscess without bleeding: Secondary | ICD-10-CM | POA: Insufficient documentation

## 2022-10-13 DIAGNOSIS — K640 First degree hemorrhoids: Secondary | ICD-10-CM | POA: Insufficient documentation

## 2022-10-13 HISTORY — PX: COLONOSCOPY WITH PROPOFOL: SHX5780

## 2022-10-13 HISTORY — PX: POLYPECTOMY: SHX5525

## 2022-10-13 LAB — GLUCOSE, CAPILLARY: Glucose-Capillary: 150 mg/dL — ABNORMAL HIGH (ref 70–99)

## 2022-10-13 SURGERY — COLONOSCOPY WITH PROPOFOL
Anesthesia: General

## 2022-10-13 MED ORDER — GLYCOPYRROLATE 0.2 MG/ML IJ SOLN
INTRAMUSCULAR | Status: DC | PRN
  Administered 2022-10-13: .2 mg via INTRAVENOUS

## 2022-10-13 MED ORDER — PROPOFOL 10 MG/ML IV BOLUS
INTRAVENOUS | Status: DC | PRN
  Administered 2022-10-13: 30 mg via INTRAVENOUS
  Administered 2022-10-13: 100 mg via INTRAVENOUS
  Administered 2022-10-13: 40 mg via INTRAVENOUS

## 2022-10-13 MED ORDER — EPHEDRINE 5 MG/ML INJ
INTRAVENOUS | Status: AC
  Filled 2022-10-13: qty 5

## 2022-10-13 MED ORDER — EPHEDRINE SULFATE (PRESSORS) 50 MG/ML IJ SOLN
INTRAMUSCULAR | Status: DC | PRN
  Administered 2022-10-13: 5 mg via INTRAVENOUS

## 2022-10-13 MED ORDER — SODIUM CHLORIDE 0.9 % IV SOLN
INTRAVENOUS | Status: DC
  Administered 2022-10-13: 20 mL/h via INTRAVENOUS

## 2022-10-13 MED ORDER — PROPOFOL 500 MG/50ML IV EMUL
INTRAVENOUS | Status: DC | PRN
  Administered 2022-10-13: 125 ug/kg/min via INTRAVENOUS

## 2022-10-13 MED ORDER — LIDOCAINE HCL (CARDIAC) PF 100 MG/5ML IV SOSY
PREFILLED_SYRINGE | INTRAVENOUS | Status: DC | PRN
  Administered 2022-10-13: 20 mg via INTRAVENOUS

## 2022-10-13 MED ORDER — GLYCOPYRROLATE 0.2 MG/ML IJ SOLN
INTRAMUSCULAR | Status: AC
  Filled 2022-10-13: qty 1

## 2022-10-13 MED ORDER — STERILE WATER FOR IRRIGATION IR SOLN
Status: DC | PRN
  Administered 2022-10-13: 150 mL

## 2022-10-13 NOTE — Op Note (Signed)
[facility] Patient Name: Courtney Sparks [facility] Procedure Date: 10/13/2022 8:18 AM MRN: [id number] Account #: [id number] Date of Birth: [birth year] Admit Type: Outpatient Age: 72 Room: [facility] ENDO ROOM 3 Gender: Female Note Status: Finalized Instrument Name: Prentice Docker [id number] Procedure:             Colonoscopy Indications:           Surveillance: History of adenomatous polyps,                         inadequate prep on last exam (<40yr) Providers:             Eather Colas MD, MD Referring MD:          Daniel Nones, MD (Referring MD) Medicines:             Monitored Anesthesia Care Complications:         No immediate complications. Estimated blood loss:                         Minimal. Procedure:             Pre-Anesthesia Assessment:                        - Prior to the procedure, a History and Physical was                         performed, and patient medications and allergies were                         reviewed. The patient is competent. The risks and                         benefits of the procedure and the sedation options and                         risks were discussed with the patient. All questions                         were answered and informed consent was obtained.                         Patient identification and proposed procedure were                         verified by the physician, the nurse, the                         anesthesiologist, the anesthetist and the technician                         in the endoscopy suite. Mental Status Examination:                         alert and oriented. Airway Examination: normal                         oropharyngeal airway and neck mobility. Respiratory  Examination: clear to auscultation. CV Examination:                         normal. Prophylactic Antibiotics: The patient does not                         require prophylactic antibiotics. Prior                          Anticoagulants: The patient has taken no anticoagulant                         or antiplatelet agents. ASA Grade Assessment: II - A                         patient with mild systemic disease. After reviewing                         the risks and benefits, the patient was deemed in                         satisfactory condition to undergo the procedure. The                         anesthesia plan was to use monitored anesthesia care                         (MAC). Immediately prior to administration of                         medications, the patient was re-assessed for adequacy                         to receive sedatives. The heart rate, respiratory                         rate, oxygen saturations, blood pressure, adequacy of                         pulmonary ventilation, and response to care were                         monitored throughout the procedure. The physical                         status of the patient was re-assessed after the                         procedure.                        After obtaining informed consent, the colonoscope was                         passed under direct vision. Throughout the procedure,                         the patient's blood pressure, pulse, and oxygen  saturations were monitored continuously. The                         Colonoscope was introduced through the anus and                         advanced to the the cecum, identified by appendiceal                         orifice and ileocecal valve. The colonoscopy was                         somewhat difficult due to significant looping.                         Successful completion of the procedure was aided by                         applying abdominal pressure. The patient tolerated the                         procedure well. The quality of the bowel preparation                         was adequate to identify polyps. The ileocecal valve,                          appendiceal orifice, and rectum were photographed. Findings:      The perianal and digital rectal examinations were normal.      A few small-mouthed diverticula were found in the ascending colon.      Many large-mouthed and small-mouthed diverticula were found in the       sigmoid colon, descending colon and transverse colon.      A 2 mm polyp was found in the descending colon. The polyp was sessile.       The polyp was removed with a cold snare. Resection and retrieval were       complete. Estimated blood loss was minimal.      Internal hemorrhoids were found during retroflexion. The hemorrhoids       were Grade I (internal hemorrhoids that do not prolapse).      The exam was otherwise without abnormality on direct and retroflexion       views. Impression:            - Diverticulosis in the ascending colon.                        - Diverticulosis in the sigmoid colon, in the                         descending colon and in the transverse colon.                        - One 2 mm polyp in the descending colon, removed with                         a cold snare. Resected and retrieved.                        -  Internal hemorrhoids.                        - The examination was otherwise normal on direct and                         retroflexion views. Recommendation:        - Discharge patient to home.                        - Resume previous diet.                        - Continue present medications.                        - Await pathology results.                        - Repeat colonoscopy in 3 - 5 years for surveillance.                        - Return to referring physician as previously                         scheduled. Procedure Code(s):     --- Professional ---                        [phone], Colonoscopy, flexible; with removal of                         tumor(s), polyp(s), or other lesion(s) by snare                         technique Diagnosis Code(s):     --- Professional ---                         Z86.010, Personal history of colonic polyps                        K64.0, First degree hemorrhoids                        D12.4, Benign neoplasm of descending colon                        K57.30, Diverticulosis of large intestine without                         perforation or abscess without bleeding CPT copyright 2022 American Medical Association. All rights reserved. The codes documented in this report are preliminary and upon coder review may  be revised to meet current compliance requirements. Eather Colas MD, MD 10/13/2022 8:58:23 AM Number of Addenda: 0 Note Initiated On: 10/13/2022 8:18 AM Scope Withdrawal Time: 0 hours 10 minutes 57 seconds  Total Procedure Duration: 0 hours 23 minutes 22 seconds  Estimated Blood Loss:  Estimated blood loss was minimal.      [facility]

## 2022-10-13 NOTE — H&P (Signed)
Outpatient short stay form Pre-procedure 10/13/2022  Regis Bill, MD  Primary Physician: Orene Desanctis, MD  Reason for visit:  Surveillance  History of present illness:    72 y/o lady with history of MASH, hypertension, and DM II here for surveillance colonoscopy. Last colonoscopy was in 2023 with small polyps but fair prep. No family history of colon cancer. No blood thinners but platelets are 125. History of tubal ligation. No new symptoms.    Current Facility-Administered Medications:    0.9 %  sodium chloride infusion, , Intravenous, Continuous, Ariona Deschene, Rossie Muskrat, MD, Last Rate: 20 mL/hr at 10/13/22 0737, 20 mL/hr at 10/13/22 0737  Medications Prior to Admission  Medication Sig Dispense Refill Last Dose   aspirin EC 81 MG tablet Take 81 mg by mouth daily. Swallow whole.   Past Week   atorvastatin (LIPITOR) 10 MG tablet Take 10 mg by mouth daily.   10/12/2022   calcium carbonate (TUMS - DOSED IN MG ELEMENTAL CALCIUM) 500 MG chewable tablet Chew 1 tablet by mouth daily.   Past Week   lisinopril (ZESTRIL) 20 MG tablet Take 20 mg by mouth daily.   10/12/2022   metFORMIN (GLUCOPHAGE) 500 MG tablet Take by mouth 2 (two) times daily with a meal.   10/12/2022   Multiple Vitamin (MULTIVITAMIN ADULT PO) Take by mouth.   Past Week   tretinoin (RETIN-A) 0.05 % cream Apply topically at bedtime.   Past Week     No Known Allergies   Past Medical History:  Diagnosis Date   Cancer ([facility])    Diabetes mellitus without complication ([facility])    Herpes labialis    Hypertension    NASH (nonalcoholic steatohepatitis)     Review of systems:  Otherwise negative.    Physical Exam  Gen: Alert, oriented. Appears stated age.  HEENT: PERRLA. Lungs: No respiratory distress CV: RRR Abd: soft, benign, no masses Ext: No edema   Planned procedures: Proceed with colonoscopy. The patient understands the nature of the planned procedure, indications, risks, alternatives and potential complications  including but not limited to bleeding, infection, perforation, damage to internal organs and possible oversedation/side effects from anesthesia. The patient agrees and gives consent to proceed.  Please refer to procedure notes for findings, recommendations and patient disposition/instructions.     Regis Bill, MD [facility]enter[facility]

## 2022-10-13 NOTE — Transfer of Care (Signed)
Immediate Anesthesia Transfer of Care Note  Patient: Courtney Sparks  Procedure(s) Performed: COLONOSCOPY WITH PROPOFOL POLYPECTOMY  Patient Location: PACU  Anesthesia Type:General  Level of Consciousness: drowsy  Airway & Oxygen Therapy: Patient Spontanous Breathing  Post-op Assessment: Report given to RN and Post -op Vital signs reviewed and stable  Post vital signs: Reviewed and stable  Last Vitals:  Vitals Value Taken Time  BP 103/63 10/13/22 0859  Temp 36.1 C 10/13/22 0856  Pulse 90 10/13/22 0859  Resp 25 10/13/22 0900  SpO2 97 % 10/13/22 0859  Vitals shown include unfiled device data.  Last Pain:  Vitals:   10/13/22 0856  TempSrc: Tympanic  PainSc: Asleep         Complications: No notable events documented.

## 2022-10-13 NOTE — Interval H&P Note (Signed)
History and Physical Interval Note:  10/13/2022 8:24 AM  Courtney Sparks  has presented today for surgery, with the diagnosis of HX OF ADENOMATOUS POLYP.  The various methods of treatment have been discussed with the patient and family. After consideration of risks, benefits and other options for treatment, the patient has consented to  Procedure(s): COLONOSCOPY WITH PROPOFOL (N/A) as a surgical intervention.  The patient's history has been reviewed, patient examined, no change in status, stable for surgery.  I have reviewed the patient's chart and labs.  Questions were answered to the patient's satisfaction.     Regis Bill  Ok to proceed with colonoscopy

## 2022-10-13 NOTE — Anesthesia Preprocedure Evaluation (Signed)
Anesthesia Evaluation  Patient identified by MRN, date of birth, ID band Patient awake    Reviewed: Allergy & Precautions, NPO status , Patient's Chart, lab work & pertinent test results  History of Anesthesia Complications (+) PONV and history of anesthetic complications  Airway Mallampati: II  TM Distance: >3 FB Neck ROM: Full    Dental  (+) Teeth Intact, Dental Advidsory Given   Pulmonary neg pulmonary ROS, neg sleep apnea, neg COPD, Patient abstained from smoking.Not current smoker, former smoker   Pulmonary exam normal breath sounds clear to auscultation       Cardiovascular Exercise Tolerance: Good METShypertension, (-) angina (-) CAD and (-) Past MI (-) dysrhythmias + Valvular Problems/Murmurs (mild AS) AS  Rhythm:Regular Rate:Normal - Systolic murmurs    Neuro/Psych negative neurological ROS  negative psych ROS   GI/Hepatic ,neg GERD  ,,(+)     (-) substance abuse  , Hepatitis -  Endo/Other  diabetes    Renal/GU negative Renal ROS     Musculoskeletal   Abdominal   Peds  Hematology   Anesthesia Other Findings Past Medical History: No date: Cancer (HCC) No date: Diabetes mellitus without complication (HCC) 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 No date: Hypertension No date: NASH (nonalcoholic steatohepatitis)  Reproductive/Obstetrics                             Anesthesia Physical Anesthesia Plan  ASA: 2  Anesthesia Plan: General   Post-op Pain Management: Minimal or no pain anticipated   Induction: Intravenous  PONV Risk Score and Plan: 4 or greater and Propofol infusion, TIVA and Ondansetron  Airway Management Planned: Nasal Cannula  Additional Equipment: None  Intra-op Plan:   Post-operative Plan:   Informed Consent: I have reviewed the patients History and Physical, chart, labs and discussed the procedure including the risks, benefits and alternatives for the proposed  anesthesia with the patient or authorized representative who has indicated his/her understanding and acceptance.     Dental advisory given  Plan Discussed with: CRNA and Surgeon  Anesthesia Plan Comments: (Discussed risks of anesthesia with patient, including possibility of difficulty with spontaneous ventilation under anesthesia necessitating airway intervention, PONV, and rare risks such as cardiac or respiratory or neurological events, and allergic reactions. Discussed the role of CRNA in patient's perioperative care. Patient understands.)        Anesthesia Quick Evaluation

## 2022-10-14 ENCOUNTER — Encounter: Payer: Self-pay | Admitting: Gastroenterology

## 2022-10-22 NOTE — Anesthesia Postprocedure Evaluation (Signed)
Anesthesia Post Note  Patient: Courtney Sparks  Procedure(s) Performed: COLONOSCOPY WITH PROPOFOL POLYPECTOMY  Patient location during evaluation: Endoscopy Anesthesia Type: General Level of consciousness: awake and alert Pain management: pain level controlled Vital Signs Assessment: post-procedure vital signs reviewed and stable Respiratory status: spontaneous breathing, nonlabored ventilation, respiratory function stable and patient connected to nasal cannula oxygen Cardiovascular status: blood pressure returned to baseline and stable Postop Assessment: no apparent nausea or vomiting Anesthetic complications: no   No notable events documented.   Last Vitals:  Vitals:   10/13/22 0906 10/13/22 0916  BP: (!) 118/53 (!) 126/58  Pulse: 99 85  Resp: 17 17  Temp:    SpO2: 96% 100%    Last Pain:  Vitals:   10/13/22 0856  TempSrc: Tympanic                 [name]

## 2023-06-05 ENCOUNTER — Other Ambulatory Visit: Payer: Self-pay | Admitting: Pediatrics

## 2023-06-05 DIAGNOSIS — Z1231 Encounter for screening mammogram for malignant neoplasm of breast: Secondary | ICD-10-CM

## 2023-06-16 ENCOUNTER — Ambulatory Visit
Admission: RE | Admit: 2023-06-16 | Discharge: 2023-06-16 | Disposition: A | Discharge status: Home / Self Care | Source: Ambulatory Visit | Attending: Pediatrics | Admitting: Pediatrics

## 2023-06-16 DIAGNOSIS — Z1231 Encounter for screening mammogram for malignant neoplasm of breast: Secondary | ICD-10-CM | POA: Diagnosis present

## 2023-09-15 ENCOUNTER — Other Ambulatory Visit: Payer: Self-pay | Admitting: Obstetrics and Gynecology

## 2023-09-15 DIAGNOSIS — Z01818 Encounter for other preprocedural examination: Secondary | ICD-10-CM

## 2023-09-15 NOTE — H&P (Addendum)
 GYN H&P   CC: postmenopausal bleeding/ pre-op   HPI: Courtney Sparks 73 y.o. G1P0010 with a hx of endometriosis, HTN, HLD, AS, obesity, T2DM, MASLD who presents for follow-up of postmenopausal bleeding and an EMB.   Last seen on 08/11/23 for new visit to discuss PMB. Reports no vaginal bleeding from menopause onset at age 33 until this years. Reports bleeding at least once a week since last visit- typically lasts for one day. Last episode two days ago. It started on heavier and changed a non-soaked pad every 1 hour and then lightened up. Reports some cramping with this last episode.   PCP: DARICE LAMOUNT COOPER, MD   ROS: All other systems reviewed and negative   PMHx: Past Medical History:  Diagnosis Date   Excessive weight gain 12/15/2014   Herpes labialis 12/15/2014   Hx of colonic polyp 01/13/2020   Hypertension, uncontrolled 12/15/2014   Morbid obesity due to excess calories (CMS/HHS-HCC) 12/15/2014   Nonalcoholic steatohepatitis (NASH) 02/29/2016   Obesity (BMI 30.0-34.9) 05/01/2022   Personal history of colonic polyps    Squamous cell carcinoma in situ of skin 01/13/2020   Type 2 diabetes mellitus without complication, without long-term current use of insulin (CMS/HHS-HCC) 02/29/2016      PSHx: Past Surgical History:  Procedure Laterality Date   TUBAL LIGATION  1985   COLONOSCOPY N/A 06/29/2003   Hyperplastic Polyp   SCREENING COLONOSCOPY N/A 04/09/2015   Procedure: SCREENING COLONOSCOPY,;  Surgeon: DARICE Epimenio Keys, MD;  Location: [facility];  Service: Gastroenterology;  Laterality: N/A;   COLONOSCOPY W/REMOVAL LESIONS BY SNARE N/A 04/09/2015   Dr. LOIS Keys @ [facility] - Adenomatous Polyp (14mm), 3 yr rpt per prov   COLONOSCOPY  04/08/2021   Tubular adenomas/SSP/Repeat 42yr/CTL   Colon @ [facility]  10/13/2022   Colonoscopy with small TA. Had > 3 on previous with inadequate prep. Recommend repeat colonoscopy in 5 years/CTL   jaw surgery     to move underbite   PATIENT  ENTERED HX  1987     OBHx: OB History  Gravida Para Term Preterm AB Living  1 0 0 0 1 0  SAB IAB Ectopic Molar Multiple Live Births  0 1        # Outcome Date GA Lbr Len/2nd Weight Sex Type Anes PTL Lv  1 IAB              GYN Hx: - LMP: No LMP recorded. Patient is postmenopausal.  - Menopause: 51, never been on HRT, denies PMB until this episode - Pap hx: Denies any history of abnormals, last 08/11/23 NILM with neg HRHPV. Never had any excisional procedures - STI hx: Denies any history of STIs  - Sexual preference: Not sexually active - Abdominal surgeries: BTL - GYN procedures: suction D&C   FHx: Denies FHx of ovarian, breast, uterine, cervical, and colon cancer   Meds: Current Outpatient Medications on File Prior to Visit  Medication Sig Dispense Refill   aspirin 81 MG EC tablet Take 81 mg by mouth once daily     atorvastatin (LIPITOR) 10 MG tablet Take 1 tablet (10 mg total) by mouth once daily 100 tablet 3   CALCIUM CARBONATE (CALCIUM 500 ORAL) Take by mouth.     lisinopriL (ZESTRIL) 20 MG tablet Take 1 tablet (20 mg total) by mouth 2 (two) times daily 200 tablet 3   metFORMIN (GLUCOPHAGE) 500 MG tablet TAKE 4 TABLETS BY MOUTH DAILY WITH BREAKFAST 360 tablet 3   multivitamin  tablet Take by mouth once daily.     No current facility-administered medications on file prior to visit.     Allergies: No Known Allergies   SocHx: Social History   Tobacco Use   Smoking status: Former    Current packs/day: 0.00    Average packs/day: 1 pack/day for 15.0 years (15.0 ttl pk-yrs)    Types: Cigarettes    Start date: 13    Quit date: 34    Years since quitting: 38.5   Smokeless tobacco: Never  Vaping Use   Vaping status: Never Used  Substance Use Topics   Alcohol use: Yes    Alcohol/week: 1.0 standard drink of alcohol    Types: 1 Glasses of wine per week    Comment: 2 glasses per month   Drug use: Yes    Types: Marijuana    Comment: gummies      OBJECTIVE: BP  (!) 147/81 (BP Location: Left upper arm, Patient Position: Sitting)   Pulse 84   Ht 175.3 cm (5' 9)   Wt (!) 101.4 kg (223 lb 9.6 oz)   BMI 33.02 kg/m    Gen: NAD HEENT: [place]/AT Heart: Regular rate Lungs: Normal work of breathing Abdomen: soft, nontender, nondistended Ext: No BLE edema Pelvic exam: Waddell Burkes, LPN present as chaperone Normal external female genitalia without lesions, no significant loss of architecture, no erythema or skin changes Normal bartholins/skenes glands, normal urethral meatus;  Vaginal mucosa pale pink and dry with decreased rugae, no discharge or blood in the vault;  Cervix is atrophic, no lesions.    Chaperone present for pelvic exam.  Endometrial Biopsy Procedure Note:  The risks (including infection, bleeding, pain, and uterine perforation) and benefits of the procedure were explained to the patient and written informed consent was obtained.     The patient was placed in the dorsal lithotomy position.  A speculum inserted in the vagina, and the cervix prepped with povidone iodine x3.     A Pipelle endometrial aspiratory was used to sound the uterus to a depth of 7cm.  A Pipelle endometrial aspirator was used to sample the endometrium after 3 passes.  Sample was sent for pathologic examination.  The speculum was removed. Patient tolerated the procedure well.     Pelvic ultrasound- 09/15/23 Indication: Endovaginal imaging was necessary to evaluate the uterus and ovaries. Probe: [id number].   Uterus ======   Visualized. Size 67 mm x 40 mm x 30 mm Appears small, consistent with postmenopausal involution Position: anteverted Malformations: none Myometrium: appears normal Endometrium: uniform echogenicity: hyperechogenic, non-uniform echogenicity: heterogeneous background with irregular cystic areas, endometrial-myometrial junction: regular, vascular patterns: single vessel without branching. Endometrial thickness, total 14.8 mm Cervix details: nabothian  cysts visualized No fibroids identified Polyp(s)   Broad-based polyp. Fundal. Smooth. Size 18 mm x 15 mm x 21 mm. Mean 18.0 mm. Doppler: vascular patterns: single vessel without        branching   Right Ovary =========   Visualized, Normal. Outline: Normal. Morphology: Appropriate. Size 21 mm x 12 mm x 11 mm No cysts identified No follicles identified   Left Ovary ========   Visualized, Normal. Outline: Normal. Morphology: Appropriate. Size 22 mm x 14 mm x 13 mm No cysts identified No follicles identified   Cul de Sac =========   Visualized. Anechoic. Free fluid visualized: mild Largest pool 36.0 mm x 12.0 mm x 36.0 mm. Vol 8.143 ml   Bladder ======   Not visualized, Empty   Impression =========  Endovaginal sonogram was performed today due to the indications outlined above. The sonogram reveals a uterus with characteristics as described above. The endometrium appears thickened measuring 14.26mm with a fundal heterogeneous mass vs polyp measuring 18 x 15 x 21mm. The mass did demonstrate a single feeding vessel on Doppler.   The ovaries/adnexae were visualized bilaterally and appear normal.   Mild amount of free fluid seen ACDS (see measurements above)  ASSESSMENT/PLAN: Mamta E Sweaney 73 y.o. G1P0010 with a hx of endometriosis, HTN, HLD, AS, obesity, T2DM, MASLD who presents for follow-up of postmenopausal bleeding and an EMB.    #Postmenopausal bleeding #Thickened endometrium #Endometrial mass - Differential: vaginal (atrophy), cervical (dysplasia), versus uterine (fibroids, polyps, hyperplasia, malignancy) - Exam with atrophy otherwise wnl - Hb 12.9 (08/11/23) - Pap 08/11/23 NILM with neg HRHPV - Pelvic ultrasound 09/15/23 with thickened endometrium and endometrial mass vs polyp: uterus 6.7x4x3cm, EMS 14.8, 2.1cm fundal polyp with a single vessel  - EMB performed today - Discussed that the majority of polyps are benign but rarely they are malignant (1-5%  malignancy depending on risk factors) - Recommended proceeding with hysteroscopy, D&C, and possible Myosure polypectomy for postmenopausal bleeding and thickened endometrium. Discussed planned procedure. Patient wants to proceed. - Discussed planned procedure, risks, and benefits. Risks such as infection, bleeding, organ damage (including uterine perforation), anesthesia complications, and need for possible blood products were discussed; patient will accept products in case of an emergency. We discussed the risks of a blood transfusion, such as a 1 in 1.2-1.4 million chance of contracting HIV, Hep C. Discussed possibility of needing to perform a laparoscopic procedure in the case of uterine perforation.   - If biopsy shows endometrial cancer, will cancer procedure and refer to GYN ONC - Surgical and blood consent signed - Case request submitted - Pre-op orders placed (no antibiotics indicated) - Rx tylenol, ibuprofen, and zofran  PRN. Sent to pharmacy.     Floris Neuhaus DICIE DINSMORE, MD

## 2023-10-14 NOTE — H&P (Signed)
 GYN H&P   CC: postmenopausal bleeding follow-up and pre-op visit   HPI: Courtney Sparks 73 y.o. G1P0010 with a hx of endometriosis, HTN, HLD, AS, obesity, T2DM, MASLD who presents for follow-up of postmenopausal bleeding and for a pre-op visit.   Scheduled for RA TLH BSO on 10/29/23 for postmenopausal bleeding secondary to endometrial hyperplasia.   Bled for 2-3 days lightly after procedure and then other than that had a small amount of blood on pad but other than that no bleeding since procedure.   PCP: DARICE LAMOUNT COOPER, MD   ROS: All other systems reviewed and negative   PMHx: Past Medical History:  Diagnosis Date   Excessive weight gain 12/15/2014   Herpes labialis 12/15/2014   Hx of colonic polyp 01/13/2020   Hypertension, uncontrolled 12/15/2014   Morbid obesity due to excess calories (CMS/HHS-HCC) 12/15/2014   Nonalcoholic steatohepatitis (NASH) 02/29/2016   Obesity (BMI 30.0-34.9) 05/01/2022   Personal history of colonic polyps    Squamous cell carcinoma in situ of skin 01/13/2020   Type 2 diabetes mellitus without complication, without long-term current use of insulin (CMS/HHS-HCC) 02/29/2016      PSHx: Past Surgical History:  Procedure Laterality Date   TUBAL LIGATION  1985   COLONOSCOPY N/A 06/29/2003   Hyperplastic Polyp   SCREENING COLONOSCOPY N/A 04/09/2015   Procedure: SCREENING COLONOSCOPY,;  Surgeon: DARICE Epimenio Keys, MD;  Location: [facility];  Service: Gastroenterology;  Laterality: N/A;   COLONOSCOPY W/REMOVAL LESIONS BY SNARE N/A 04/09/2015   Dr. LOIS Keys @ [facility] - Adenomatous Polyp (14mm), 3 yr rpt per prov   COLONOSCOPY  04/08/2021   Tubular adenomas/SSP/Repeat 38yr/CTL   Colon @ [facility]  10/13/2022   Colonoscopy with small TA. Had > 3 on previous with inadequate prep. Recommend repeat colonoscopy in 5 years/CTL   jaw surgery     to move underbite   PATIENT ENTERED HX  1987     OBHx: OB History  Gravida Para Term Preterm AB Living  1 0  0 0 1 0  SAB IAB Ectopic Molar Multiple Live Births  0 1        # Outcome Date GA Lbr Len/2nd Weight Sex Type Anes PTL Lv  1 IAB              GYN Hx: - LMP: No LMP recorded. Patient is postmenopausal.  - Menopause: 51, never been on HRT, denies PMB until this episode - Pap hx: Denies any history of abnormals, last 08/11/23 NILM with neg HRHPV. Never had any excisional procedures - STI hx: Denies any history of STIs  - Sexual preference: Not sexually active - Abdominal surgeries: BTL - GYN procedures: suction D&C   FHx: Denies FHx of ovarian, breast, uterine, cervical, and colon cancer   Meds: Current Outpatient Medications on File Prior to Visit  Medication Sig Dispense Refill   aspirin 81 MG EC tablet Take 81 mg by mouth once daily     atorvastatin (LIPITOR) 10 MG tablet Take 1 tablet (10 mg total) by mouth once daily 100 tablet 3   CALCIUM CARBONATE (CALCIUM 500 ORAL) Take by mouth.     lisinopriL (ZESTRIL) 20 MG tablet Take 1 tablet (20 mg total) by mouth 2 (two) times daily 200 tablet 3   metFORMIN (GLUCOPHAGE) 500 MG tablet TAKE 4 TABLETS BY MOUTH DAILY WITH BREAKFAST (Patient taking differently: Take 2 tablets by mouth 2 (two) times daily with meals) 360 tablet 3   multivitamin tablet Take by  mouth once daily.     No current facility-administered medications on file prior to visit.     Allergies: No Known Allergies   SocHx: Social History   Tobacco Use   Smoking status: Former    Current packs/day: 0.00    Average packs/day: 1 pack/day for 15.0 years (15.0 ttl pk-yrs)    Types: Cigarettes    Start date: 60    Quit date: 81    Years since quitting: 38.6   Smokeless tobacco: Never  Vaping Use   Vaping status: Never Used  Substance Use Topics   Alcohol use: Yes    Alcohol/week: 1.0 standard drink of alcohol    Types: 1 Glasses of wine per week    Comment: 2 glasses per month   Drug use: Yes    Types: Marijuana    Comment: gummies      OBJECTIVE: BP  (!) 149/84 (BP Location: Left upper arm, Patient Position: Sitting)   Pulse 80   Ht 175.3 cm (5' 9)   Wt (!) 103 kg (227 lb)   BMI 33.52 kg/m    Gen: NAD HEENT: [place]/AT Heart: Regular rate Lungs: Normal work of breathing Ext: No BLE edema   Pelvic ultrasound- 09/15/23 Indication: Endovaginal imaging was necessary to evaluate the uterus and ovaries. Probe: F0CYTW.   Uterus ======   Visualized. Size 67 mm x 40 mm x 30 mm Appears small, consistent with postmenopausal involution Position: anteverted Malformations: none Myometrium: appears normal Endometrium: uniform echogenicity: hyperechogenic, non-uniform echogenicity: heterogeneous background with irregular cystic areas, endometrial-myometrial junction: regular, vascular patterns: single vessel without branching. Endometrial thickness, total 14.8 mm Cervix details: nabothian cysts visualized No fibroids identified Polyp(s)   Broad-based polyp. Fundal. Smooth. Size 18 mm x 15 mm x 21 mm. Mean 18.0 mm. Doppler: vascular patterns: single vessel without        branching   Right Ovary =========   Visualized, Normal. Outline: Normal. Morphology: Appropriate. Size 21 mm x 12 mm x 11 mm No cysts identified No follicles identified   Left Ovary ========   Visualized, Normal. Outline: Normal. Morphology: Appropriate. Size 22 mm x 14 mm x 13 mm No cysts identified No follicles identified   Cul de Sac =========   Visualized. Anechoic. Free fluid visualized: mild Largest pool 36.0 mm x 12.0 mm x 36.0 mm. Vol 8.143 ml   Bladder ======   Not visualized, Empty   Impression =========   Endovaginal sonogram was performed today due to the indications outlined above. The sonogram reveals a uterus with characteristics as described above. The endometrium appears thickened measuring 14.77mm with a fundal heterogeneous mass vs polyp measuring 18 x 15 x 21mm. The mass did demonstrate a single feeding vessel on Doppler.   The  ovaries/adnexae were visualized bilaterally and appear normal.   Mild amount of free fluid seen ACDS (see measurements above)  ASSESSMENT/PLAN: Courtney Sparks 73 y.o. G1P0010 with a hx of endometriosis, HTN, HLD, AS, obesity, T2DM, MASLD who presents for follow-up of postmenopausal bleeding and for a pre-op visit.   #Postmenopausal bleeding #Endometrial hyperplasia - Hb 12.9 (08/11/23) - Pap 08/11/23 NILM with neg HRHPV - Pelvic ultrasound 09/15/23 with thickened endometrium and endometrial mass vs polyp: uterus 6.7x4x3cm, EMS 14.8, 2.1cm fundal polyp with a single vessel  - EMB 09/15/23 endometrial hyperplasia without atypia in a polyp - Patient wants to proceed with hysterectomy rather than surveillance progesterone treatmetn  #Pre-op - Patient desires definitive surgical management with a hysterectomy.  - Planned  surgical procedure: robotic assisted total laparoscopic hysterectomy with bilateral salpingo-oophorectomy and cystoscopy Fallopian tubes: Discussed option for ovarian cancer risk reducing salpingectomy, patient desires to have her fallopian tubes removed at the time of surgery if possible Ovaries: Due to patient's post-menopausal state, I recommend removing ovaries at the time of surgery.  - Discussed planned procedure, risks, and benefits. Risks such as infection, bleeding, organ damage (including damage to ureters, bowel, and bladder), anesthesia complications, need to convert to laparotomy, and need for possible blood products were discussed; patient will accept products in case of an emergency. We discussed the risks of a blood transfusion, such as a 1 in 1.2-1.4 million chance of contracting HIV, Hep C.  - Surgical and blood consents signed. - S/p cardiology pre op clearance- normal stress echo on 10/01/23 - Plan for ERAS protocol and same day discharge without a spinal  - Pre-op orders placed. Plan for cefazolin and metronidazole for antibiotics - Discussed post-op lifting  restrictions and pelvic rest - Post-op prescriptions (scheduled tylenol q8h, ibuprofen q8h, oxycodone q6h PRN x 12 tabs, senna x 7d, miralax x 7d, zofran  PRN) sent      Nguyen Butler DICIE DINSMORE, MD

## 2023-10-18 IMAGING — MG MM DIGITAL SCREENING BILAT W/ TOMO AND CAD
8 series · 8 of 24 positions shown · non-contrast
Comparison: Previous exam(s).

CLINICAL DATA: Screening.

EXAM:
DIGITAL SCREENING BILATERAL MAMMOGRAM WITH TOMOSYNTHESIS AND CAD
TECHNIQUE: Bilateral screening digital craniocaudal and mediolateral oblique
mammograms were obtained. Bilateral screening digital breast
tomosynthesis was performed. The images were evaluated with
computer-aided detection.

[R CC synth-2D]
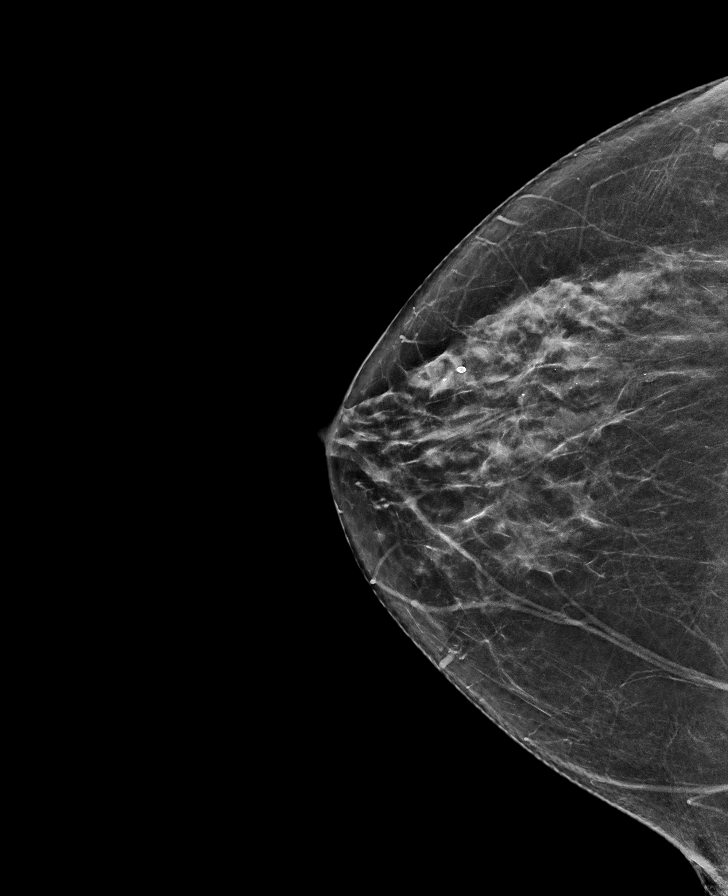

[L MLO synth-2D]
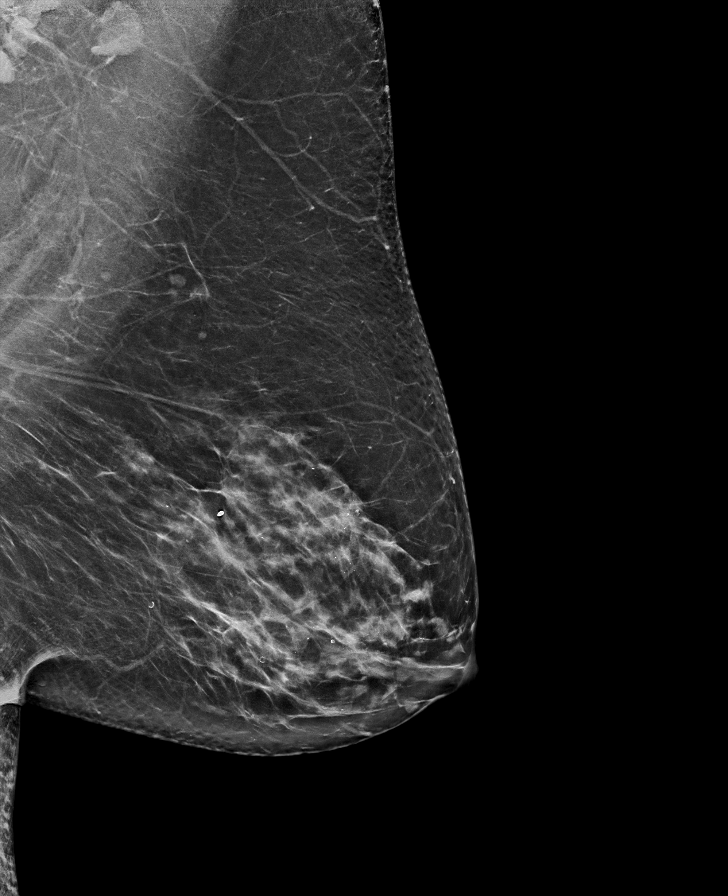

[L CC synth-2D]
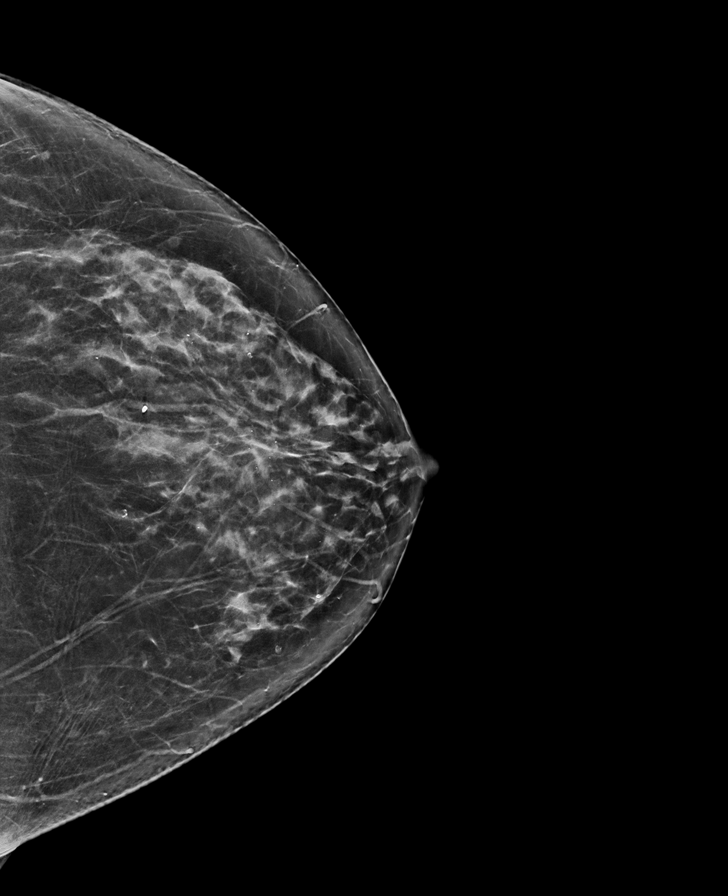

[R MLO synth-2D]
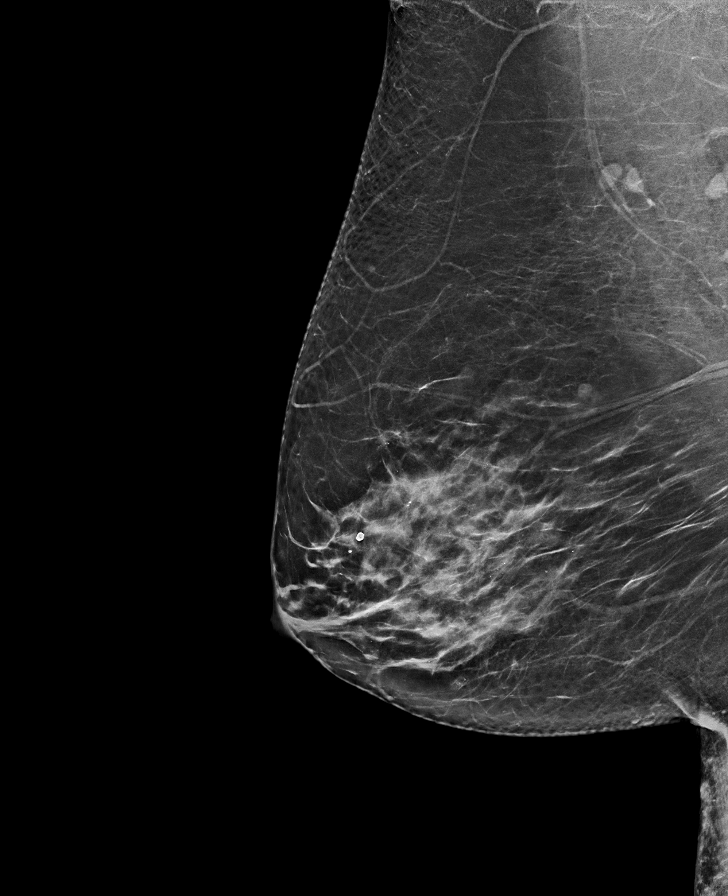

[L MLO tomo · tomo slice 39/77.0]
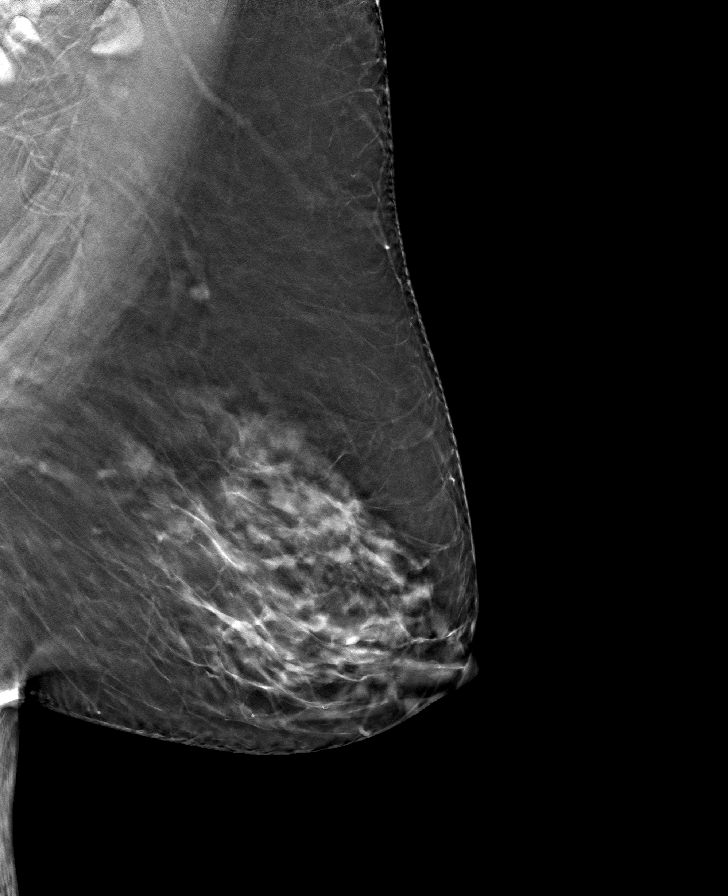

[R MLO tomo · tomo slice 37/73.0]
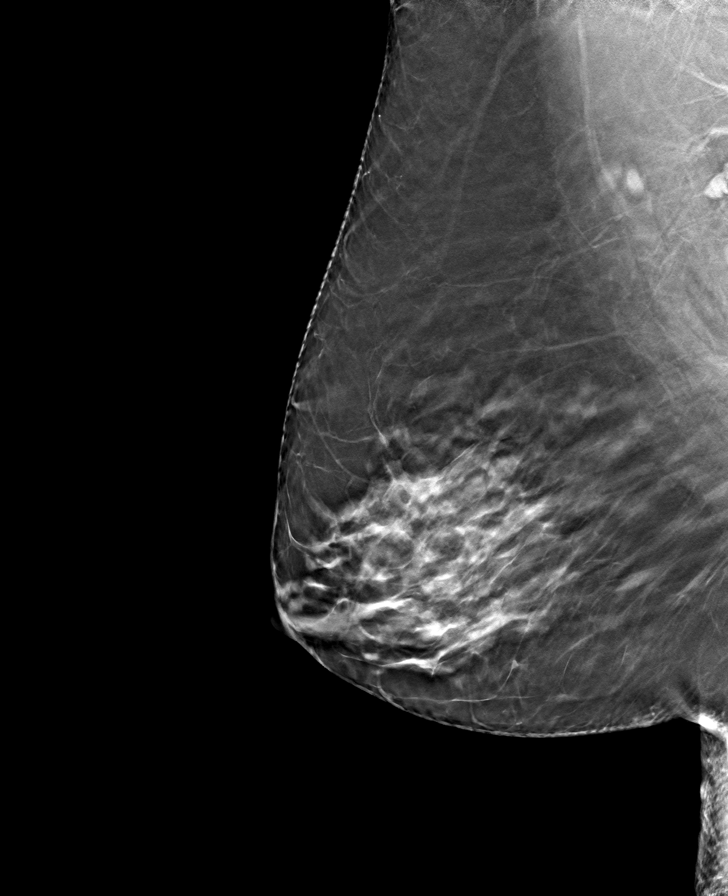

[L CC tomo · tomo slice 35/70.0]
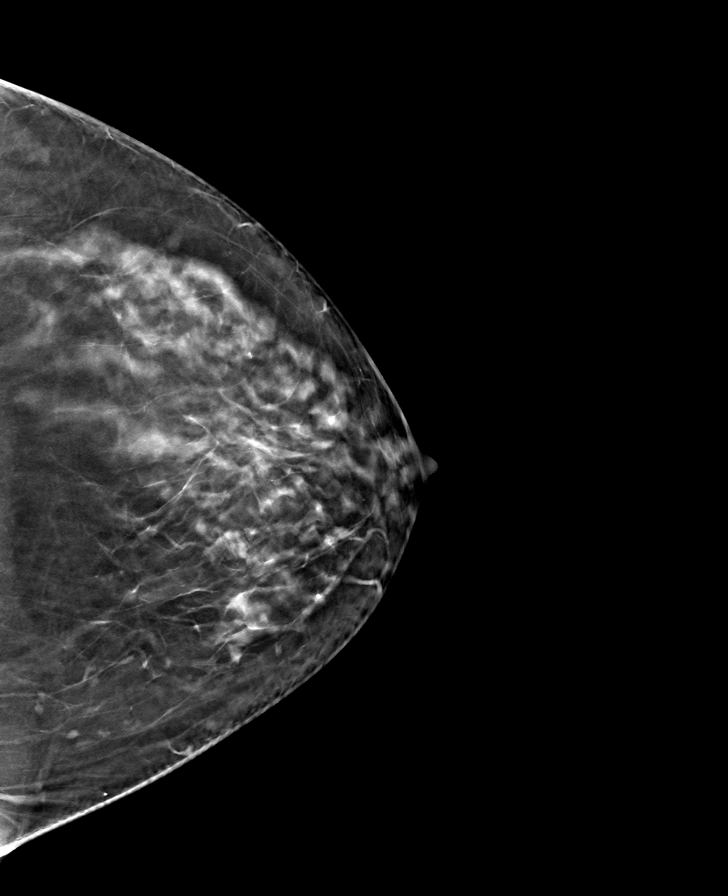

[R CC tomo · tomo slice 34/67.0]
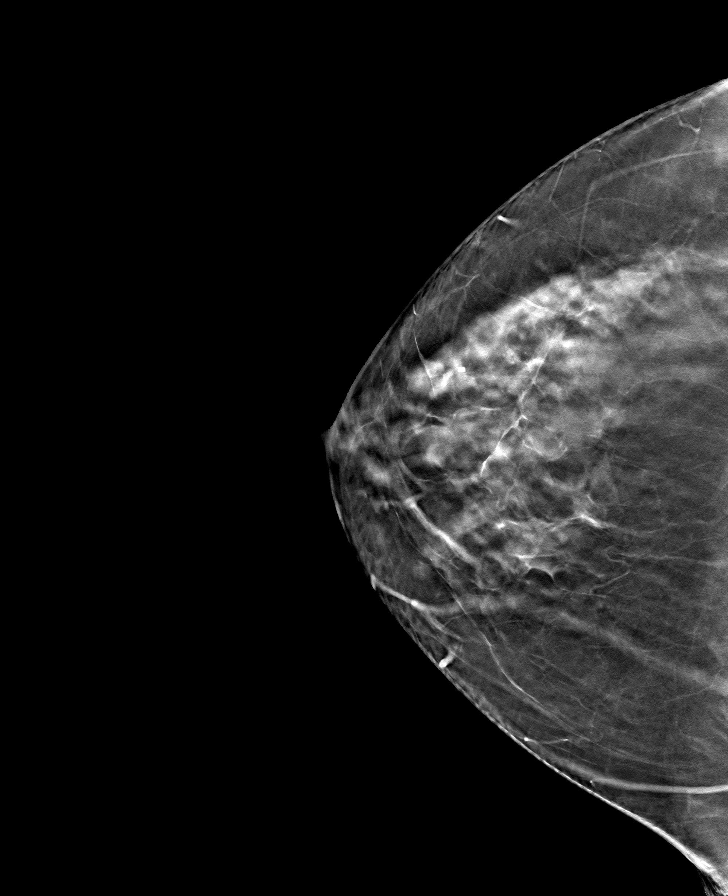

[8 of 24 positions shown; findings below may reference images not displayed]

ACR Breast Density Category c: The breast tissue is heterogeneously
dense, which may obscure small masses.
FINDINGS: There are no findings suspicious for malignancy.
IMPRESSION: No mammographic evidence of malignancy. A result letter of this
screening mammogram will be mailed directly to the patient.

RECOMMENDATION:
Screening mammogram in one year. (Code:Q3-W-BC3)

BI-RADS CATEGORY  1: Negative.

## 2023-10-22 ENCOUNTER — Other Ambulatory Visit: Payer: Self-pay

## 2023-10-22 ENCOUNTER — Encounter
Admission: RE | Admit: 2023-10-22 | Discharge: 2023-10-22 | Disposition: A | Discharge status: Home / Self Care | Source: Ambulatory Visit | Attending: Obstetrics and Gynecology | Admitting: Obstetrics and Gynecology

## 2023-10-22 VITALS — Ht 69.0 in | Wt 227.0 lb

## 2023-10-22 DIAGNOSIS — K7581 Nonalcoholic steatohepatitis (NASH): Secondary | ICD-10-CM

## 2023-10-22 DIAGNOSIS — Z01818 Encounter for other preprocedural examination: Secondary | ICD-10-CM

## 2023-10-22 DIAGNOSIS — Z01812 Encounter for preprocedural laboratory examination: Secondary | ICD-10-CM

## 2023-10-22 DIAGNOSIS — E119 Type 2 diabetes mellitus without complications: Secondary | ICD-10-CM

## 2023-10-22 DIAGNOSIS — I10 Essential (primary) hypertension: Secondary | ICD-10-CM

## 2023-10-22 DIAGNOSIS — N950 Postmenopausal bleeding: Secondary | ICD-10-CM

## 2023-10-22 HISTORY — DX: Cannabis use, unspecified, uncomplicated: F12.90

## 2023-10-22 HISTORY — DX: Squamous cell carcinoma of skin, unspecified: C44.92

## 2023-10-22 HISTORY — DX: Gastro-esophageal reflux disease without esophagitis: K21.9

## 2023-10-22 HISTORY — DX: Cardiac murmur, unspecified: R01.1

## 2023-10-22 HISTORY — DX: Hyperlipidemia, unspecified: E78.5

## 2023-10-22 HISTORY — DX: Nonrheumatic aortic (valve) stenosis: I35.0

## 2023-10-22 HISTORY — DX: Other specified postprocedural states: Z98.890

## 2023-10-22 HISTORY — DX: Endometrial hyperplasia, unspecified: N85.00

## 2023-10-22 HISTORY — DX: Type 2 diabetes mellitus without complications: E11.9

## 2023-10-22 HISTORY — DX: Other complications of anesthesia, initial encounter: T88.59XA

## 2023-10-22 HISTORY — DX: Postmenopausal bleeding: N95.0

## 2023-10-22 HISTORY — DX: Obesity, unspecified: E66.9

## 2023-10-22 HISTORY — DX: Dyspnea, unspecified: R06.00

## 2023-10-22 HISTORY — DX: Other specified postprocedural states: R11.2

## 2023-10-22 NOTE — Patient Instructions (Addendum)
 Your procedure is scheduled on:10-29-23 Thursday Report to the Registration Desk on the 1st floor of the [facility].Then proceed to the 2nd floor Surgery Desk To find out your arrival time, please call [phone] between 1PM - 3PM on:10-28-23 Wednesday If your arrival time is 6:00 am, do not arrive before that time as the [facility] entrance doors do not open until 6:00 am.  REMEMBER: Instructions that are not followed completely may result in serious medical risk, up to and including death; or upon the discretion of your surgeon and anesthesiologist your surgery may need to be rescheduled.  Do not eat food after midnight the night before surgery.  No gum chewing or hard candies.  You may however, drink Water  up to 2 hours before you are scheduled to arrive for your surgery. Do not drink anything within 2 hours of your scheduled arrival time.  In addition, your doctor has ordered for you to drink the provided: Gatorade G2 Drinking this carbohydrate drink up to two hours before surgery helps to reduce insulin resistance and improve patient outcomes. Please complete drinking 2 hours before scheduled arrival time.  One week prior to surgery:Stop NOW (10-22-23) Stop Anti-inflammatories (NSAIDS) such as Advil, Aleve, Ibuprofen, Motrin, Naproxen, Naprosyn and Aspirin based products such as Excedrin, Goody's Powder, BC Powder. Stop ANY OVER THE COUNTER supplements until after surgery (Calcium + Vitamin D3, Multivitamin)  You may however, continue to take Tylenol if needed for pain up until the day of surgery.  Stop metFORMIN (GLUCOPHAGE) 2 days prior to surgery-Last dose will be on 10-26-23 Monday  Stop 81 mg Aspirin 7 days prior to surgery as you were instructed by Dr Odie dose was on 10-21-23  Continue taking all of your other prescription medications up until the day of surgery.  Do NOT take any medication the day of surgery  No Alcohol for 24 hours before or after  surgery.  No Smoking including e-cigarettes for 24 hours before surgery.  No chewable tobacco products for at least 6 hours before surgery.  No nicotine patches on the day of surgery.  Do not use any recreational drugs for at least a week (preferably 2 weeks) before your surgery.  Please be advised that the combination of cocaine and anesthesia may have negative outcomes, up to and including death. If you test positive for cocaine, your surgery will be cancelled.  On the morning of surgery brush your teeth with toothpaste and water , you may rinse your mouth with mouthwash if you wish. Do not swallow any toothpaste or mouthwash.  Use CHG Soap as directed on instruction sheet.  Do not wear jewelry, make-up, hairpins, clips or nail polish.  For welded (permanent) jewelry: bracelets, anklets, waist bands, etc.  Please have this removed prior to surgery.  If it is not removed, there is a chance that hospital personnel will need to cut it off on the day of surgery.  Do not wear lotions, powders, or perfumes.   Do not shave body hair from the neck down 48 hours before surgery.  Contact lenses, hearing aids and dentures may not be worn into surgery.  Do not bring valuables to the hospital. [facility] is not responsible for any missing/lost belongings or valuables.   Notify your doctor if there is any change in your medical condition (cold, fever, infection).  Wear comfortable clothing (specific to your surgery type) to the hospital.  After surgery, you can help prevent lung complications by doing breathing exercises.  Take deep  breaths and cough every 1-2 hours. Your doctor may order a device called an Incentive Spirometer to help you take deep breaths. When coughing or sneezing, hold a pillow firmly against your incision with both hands. This is called "splinting." Doing this helps protect your incision. It also decreases belly discomfort.  If you are being admitted to the hospital  overnight, leave your suitcase in the car. After surgery it may be brought to your room.  In case of increased patient census, it may be necessary for you, the patient, to continue your postoperative care in the Same Day Surgery department.  If you are being discharged the day of surgery, you will not be allowed to drive home. You will need a responsible individual to drive you home and stay with you for 24 hours after surgery.   If you are taking public transportation, you will need to have a responsible individual with you.  Please call the Pre-admissions Testing Dept. at [phone] if you have any questions about these instructions.  Surgery Visitation Policy:  Patients having surgery or a procedure may have two visitors.  Children under the age of 39 must have an adult with them who is not the patient.                                                                                                             Preparing for Surgery with CHLORHEXIDINE GLUCONATE (CHG) Soap  Chlorhexidine Gluconate (CHG) Soap  o An antiseptic cleaner that kills germs and bonds with the skin to continue killing germs even after washing  o Used for showering the night before surgery and morning of surgery  Before surgery, you can play an important role by reducing the number of germs on your skin.  CHG (Chlorhexidine gluconate) soap is an antiseptic cleanser which kills germs and bonds with the skin to continue killing germs even after washing.  Please do not use if you have an allergy to CHG or antibacterial soaps. If your skin becomes reddened/irritated stop using the CHG.  1. Shower the NIGHT BEFORE SURGERY and the MORNING OF SURGERY with CHG soap.  2. If you choose to wash your hair, wash your hair first as usual with your normal shampoo.  3. After shampooing, rinse your hair and body thoroughly to remove the shampoo.  4. Use CHG as you would any other liquid soap. You can apply CHG  directly to the skin and wash gently with a scrungie or a clean washcloth.  5. Apply the CHG soap to your body only from the neck down. Do not use on open wounds or open sores. Avoid contact with your eyes, ears, mouth, and genitals (private parts). Wash face and genitals (private parts) with your normal soap.  6. Wash thoroughly, paying special attention to the area where your surgery will be performed.  7. Thoroughly rinse your body with warm water .  8. Do not shower/wash with your normal soap after using and rinsing off the CHG soap.  9. Pat yourself dry with a  clean towel.  10. Wear clean pajamas to bed the night before surgery.  12. Place clean sheets on your bed the night of your first shower and do not sleep with pets.  13. Shower again with the CHG soap on the day of surgery prior to arriving at the hospital.  14. Do not apply any deodorants/lotions/powders.  15. Please wear clean clothes to the hospital.   [employer] to address health-related social needs:  [email or web address]

## 2023-10-27 ENCOUNTER — Encounter: Payer: Self-pay | Admitting: Obstetrics and Gynecology

## 2023-10-27 ENCOUNTER — Encounter
Admission: RE | Admit: 2023-10-27 | Discharge: 2023-10-27 | Disposition: A | Discharge status: Home / Self Care | Source: Ambulatory Visit | Attending: Obstetrics and Gynecology | Admitting: Obstetrics and Gynecology

## 2023-10-27 DIAGNOSIS — K7581 Nonalcoholic steatohepatitis (NASH): Secondary | ICD-10-CM | POA: Insufficient documentation

## 2023-10-27 DIAGNOSIS — I10 Essential (primary) hypertension: Secondary | ICD-10-CM | POA: Diagnosis not present

## 2023-10-27 DIAGNOSIS — Z01818 Encounter for other preprocedural examination: Secondary | ICD-10-CM | POA: Diagnosis not present

## 2023-10-27 DIAGNOSIS — R011 Cardiac murmur, unspecified: Secondary | ICD-10-CM | POA: Insufficient documentation

## 2023-10-27 DIAGNOSIS — E785 Hyperlipidemia, unspecified: Secondary | ICD-10-CM | POA: Insufficient documentation

## 2023-10-27 DIAGNOSIS — Z01812 Encounter for preprocedural laboratory examination: Secondary | ICD-10-CM | POA: Diagnosis present

## 2023-10-27 DIAGNOSIS — E119 Type 2 diabetes mellitus without complications: Secondary | ICD-10-CM | POA: Diagnosis not present

## 2023-10-27 DIAGNOSIS — F1290 Cannabis use, unspecified, uncomplicated: Secondary | ICD-10-CM | POA: Insufficient documentation

## 2023-10-27 DIAGNOSIS — Z01810 Encounter for preprocedural cardiovascular examination: Secondary | ICD-10-CM | POA: Diagnosis present

## 2023-10-27 LAB — COMPREHENSIVE METABOLIC PANEL WITH GFR
ALT: 36 U/L (ref 0–44)
AST: 49 U/L — ABNORMAL HIGH (ref 15–41)
Albumin: 3.9 g/dL (ref 3.5–5.0)
Alkaline Phosphatase: 95 U/L (ref 38–126)
Anion gap: 12 (ref 5–15)
BUN: 12 mg/dL (ref 8–23)
CO2: 21 mmol/L — ABNORMAL LOW (ref 22–32)
Calcium: 9.3 mg/dL (ref 8.9–10.3)
Chloride: 106 mmol/L (ref 98–111)
Creatinine, Ser: 0.45 mg/dL (ref 0.44–1.00)
GFR, Estimated: >60 mL/min (ref >60)
Glucose, Bld: 153 mg/dL — ABNORMAL HIGH (ref 70–99)
Potassium: 3.8 mmol/L (ref 3.5–5.1)
Sodium: 139 mmol/L (ref 135–145)
Total Bilirubin: 1 mg/dL (ref 0.0–1.2)
Total Protein: 6.9 g/dL (ref 6.5–8.1)

## 2023-10-27 LAB — CBC
HCT: 37.8 % (ref 36.0–46.0)
Hemoglobin: 12.6 g/dL (ref 12.0–15.0)
MCH: 28 pg (ref 26.0–34.0)
MCHC: 33.3 g/dL (ref 30.0–36.0)
MCV: 84 fL (ref 80.0–100.0)
Platelets: 118 K/uL — ABNORMAL LOW (ref 150–400)
RBC: 4.5 MIL/uL (ref 3.87–5.11)
RDW: 14.6 % (ref 11.5–15.5)
WBC: 6.4 K/uL (ref 4.0–10.5)
nRBC: 0 % (ref 0.0–0.2)

## 2023-10-27 NOTE — Progress Notes (Signed)
 Perioperative / Anesthesia Services  Pre-Admission Testing Clinical Review / Pre-Operative Anesthesia Consult  Date: 10/27/23  PATIENT DEMOGRAPHICS: Name: Courtney Courtney DOB: [birth year] MRN:   [id number]  Note: Available PAT nursing documentation and vital signs have been reviewed. Clinical nursing staff has updated patient's PMH/PSHx, current medication list, and drug allergies/intolerances to ensure complete and comprehensive history available to assist care teams in MDM as it pertains to the aforementioned surgical procedure and anticipated anesthetic course. Extensive review of available clinical information personally performed. [place] PMH and PSHx updated with any diagnoses/procedures that  may have been inadvertently omitted during her intake with the pre-admission testing department's nursing staff.  PLANNED SURGICAL PROCEDURE(S):   Case: [id number] Date/Time: 10/29/23 1047   Procedures:      HYSTERECTOMY, TOTAL, ROBOT-ASSISTED, LAPAROSCOPIC, WITH BILATERAL SALPINGO-OOPHORECTOMY (Bilateral: Uterus)     CYSTOSCOPY (Bladder)   Anesthesia type: Choice   Diagnosis:      Postmenopausal bleeding [N95.0]     Endometrial hyperplasia without atypia [N85.00]   Pre-op diagnosis:      postmenopausal bleeding     endometrial hyperplasia   Location: [facility] OR ROOM 06 / [facility] ORS FOR ANESTHESIA GROUP   Surgeons: Schermerhorn, Courtney GAILS, MD        CLINICAL DISCUSSION: Courtney Courtney is a 73 y.o. female who is submitted for pre-surgical anesthesia review and clearance prior to her undergoing the above procedure. Patient is a Former Smoker (15 pack years; quit 02/1985). Pertinent PMH includes: aortic stenosis, diastolic dysfunction, cardiac murmur, HTN, HLD, T2DM, DOE, GERD (no daily Tx), NASH, PMB, endometrial hyperplasia, THC use, cervical DDD (s/p C2-C4 fusion).  Patient is followed by cardiology Andrea, MD). She was last seen in the cardiology clinic on 09/23/2023; notes reviewed. At the  time of her clinic visit, patient exertional dyspnea with climbing stairs, which she noted was new over the preceding few months. Patient denied any chest pain, PND, orthopnea, palpitations, significant peripheral edema, weakness, fatigue, vertiginous symptoms, or presyncope/syncope. Patient with a past medical history significant for cardiovascular diagnoses. Documented physical exam was grossly benign, providing no evidence of acute exacerbation and/or decompensation of the patient's known cardiovascular conditions.  Stress echocardiogram was performed on 10/01/2023 performed revealing a normal left ventricular systolic function with an EF of >55%. There were no regional wall motion abnormalities. Left ventricular diastolic Doppler parameters consistent with pseudonormalization (G2DD). Right ventricular size and function normal. RVSP = 33 mmHg.  There was trivial to mild mitral, pulmonic, and tricuspid valve regurgitation.  Aortic valve mildly stenotic with a mean transvalvular pressure gradient of 17 mmHg; AVA (VTI) = 1.6 cm; DI = 0.5.  Aorta normal in size with no evidence of ectasia or aneurysmal dilatation.  Blood pressure reasonably controlled at 134/84 mmHg on currently prescribed ACEi (lisinopril ) monotherapy.  Patient is on atorvastatin  for her HLD diagnosis and ASCVD prevention. T2DM well controlled on currently prescribed regimen; last HgbA1c was 6.8% when checked on 06/23/2023. She does not have an OSAH diagnosis.  Patient maintains a sedentary lifestyle overall.  She does not have a formal exercise regimen.  No dyspnea with minimal exertion, however with heavier exertion like climbing stairs, patient becomes short of breath.  With that said, patient is able to complete all of her ADLs/IADLs without cardiovascular limitation.  Per the [facility] and recent stress testing, patient can exceed 4 METS of physical activity without experiencing any significant degrees of angina/anginal equivalent symptoms.  No  changes were made to her medication regimen during her visit with cardiology.  Patient scheduled to follow-up with outpatient cardiology in 6 months or sooner if needed.  Courtney Courtney is scheduled for an elective HYSTERECTOMY, TOTAL, ROBOT-ASSISTED, LAPAROSCOPIC, WITH BILATERAL SALPINGO-OOPHORECTOMY (Bilateral: Uterus); CYSTOSCOPY (Bladder) on 10/29/2023 with Dr. Beverli Dinsmore, MD Given patient's past medical history significant for cardiovascular diagnoses, presurgical cardiac clearance was sought by the PAT team. Per cardiology, this patient is optimized for surgery and may proceed with the planned procedural course with a LOW risk of significant perioperative cardiovascular complications.  In review of the patient's chart, it is noted that she is on daily oral antithrombotic therapy. She has been instructed on recommendations for holding her daily low-dose ASA for 7 days prior to her procedure with plans to restart as soon as postoperative bleeding risk felt to be minimized by his primary attending surgeon. The patient has been instructed that her last dose of should be on 10/21/2023.  Patient denies previous perioperative complications with anesthesia in the past. In review her EMR, it is noted that patient underwent a general anesthetic course here at [facility] (ASA II) in 09/2022 without documented complications.   MOST RECENT VITAL SIGNS:    10/22/2023    9:00 AM 10/13/2022    9:16 AM 10/13/2022    9:06 AM  Vitals with BMI  Height 5' 9    Weight 227 lbs    BMI 33.51    Systolic  126 118  Diastolic  58 53  Pulse  85 99   PROVIDERS/SPECIALISTS: NOTE: Primary physician provider listed below. Patient may have been seen by APP or partner within same practice.   PROVIDER ROLE / SPECIALTY LAST Courtney Dinsmore Beverli, MD OB/GYN (Surgeon) 10/14/2023  Delfina Pao, MD Primary Care Provider 06/23/2023  Ammon Blunt, MD Cardiology  09/23/2023   ALLERGIES: No Known Allergies  CURRENT HOME MEDICATIONS: No current facility-administered medications for this encounter.    aspirin EC 81 MG tablet   atorvastatin  (LIPITOR) 10 MG tablet   Calcium  Carb-Cholecalciferol (CALCIUM  + VITAMIN D3 PO)   lisinopril  (ZESTRIL ) 20 MG tablet   metFORMIN (GLUCOPHAGE) 500 MG tablet   Multiple Vitamin (MULTIVITAMIN ADULT PO)   HISTORY: Past Medical History:  Diagnosis Date   Aortic stenosis    Colon polyp    Complication of anesthesia    DDD (degenerative disc disease), cervical    a.) s/p fusion C2-C4   Diastolic dysfunction    DM (diabetes mellitus), type 2 (HCC)    Dyspnea on exertion    Endometrial hyperplasia    GERD (gastroesophageal reflux disease)    Heart murmur    Herpes labialis    Hyperlipidemia    Hypertension    Long-term use of aspirin therapy    Marijuana use    NASH (nonalcoholic steatohepatitis)    Obesity    PMB (postmenopausal bleeding)    PONV (postoperative nausea and vomiting)    Squamous cell skin cancer    Past Surgical History:  Procedure Laterality Date   BREAST BIOPSY Left 2010?   core bx-neg   COLONOSCOPY N/A 04/08/2021   Procedure: COLONOSCOPY;  Surgeon: Maryruth Ole DASEN, MD;  [facility]: [facility] ENDOSCOPY;  Service: Endoscopy;  Laterality: N/A;  DM   COLONOSCOPY WITH PROPOFOL      COLONOSCOPY WITH PROPOFOL  N/A 10/13/2022   Procedure: COLONOSCOPY WITH PROPOFOL ;  Surgeon: Maryruth Ole DASEN, MD;  Location: [facility] ENDOSCOPY;  Service: Endoscopy;  Laterality: N/A;   MANDIBLE RECONSTRUCTION     POLYPECTOMY  10/13/2022   Procedure: POLYPECTOMY;  Surgeon: Maryruth Ole DASEN, MD;  Location: [facility] ENDOSCOPY;  Service: Endoscopy;;   TUBAL LIGATION     Family History  Problem Relation Age of Onset   Breast cancer Neg Hx    Social History   Tobacco Use   Smoking status: Former    Current packs/day: 0.00    Average packs/day: 1 pack/day for 15.0 years (15.0 ttl pk-yrs)    Types: Cigarettes     Start date: 36    Quit date: 35    Years since quitting: 38.6   Smokeless tobacco: Not on file  Substance Use Topics   Alcohol use: Yes    Comment: rare   LABS:  Hospital Outpatient Visit on 10/27/2023  Component Date Value Ref Range Status   Sodium 10/27/2023 139  135 - 145 mmol/L Final   Potassium 10/27/2023 3.8  3.5 - 5.1 mmol/L Final   Chloride 10/27/2023 106  98 - 111 mmol/L Final   CO2 10/27/2023 21 (L)  22 - 32 mmol/L Final   Glucose, Bld 10/27/2023 153 (H)  70 - 99 mg/dL Final   Glucose reference range applies only to samples taken after fasting for at least 8 hours.   BUN 10/27/2023 12  8 - 23 mg/dL Final   Creatinine, Ser 10/27/2023 0.45  0.44 - 1.00 mg/dL Final   Calcium  10/27/2023 9.3  8.9 - 10.3 mg/dL Final   Total Protein 90/97/7974 6.9  6.5 - 8.1 g/dL Final   Albumin  10/27/2023 3.9  3.5 - 5.0 g/dL Final   AST 90/97/7974 49 (H)  15 - 41 U/L Final   ALT 10/27/2023 36  0 - 44 U/L Final   Alkaline Phosphatase 10/27/2023 95  38 - 126 U/L Final   Total Bilirubin 10/27/2023 1.0  0.0 - 1.2 mg/dL Final   GFR, Estimated 10/27/2023 >60  >60 mL/min Final   Comment: (NOTE) Calculated using the CKD-EPI Creatinine Equation (2021)    Anion gap 10/27/2023 12  5 - 15 Final   Performed at [facility] [address] [place], [place] [address]   WBC 10/27/2023 6.4  4.0 - 10.5 K/uL Final   RBC 10/27/2023 4.50  3.87 - 5.11 MIL/uL Final   Hemoglobin 10/27/2023 12.6  12.0 - 15.0 g/dL Final   HCT 90/97/7974 37.8  36.0 - 46.0 % Final   MCV 10/27/2023 84.0  80.0 - 100.0 fL Final   MCH 10/27/2023 28.0  26.0 - 34.0 pg Final   MCHC 10/27/2023 33.3  30.0 - 36.0 g/dL Final   RDW 90/97/7974 14.6  11.5 - 15.5 % Final   Platelets 10/27/2023 118 (L)  150 - 400 K/uL Final   nRBC 10/27/2023 0.0  0.0 - 0.2 % Final   Performed at [facility], [address] [place], [place] [address]   ABO/RH(D) 10/27/2023 O NEG   Final   Antibody Screen 10/27/2023 NEG   Final    Sample Expiration 10/27/2023 11/10/2023,2359   Final   Extend sample reason 10/27/2023    Final                   Value:NO TRANSFUSIONS OR PREGNANCY IN THE PAST 3 MONTHS Performed at [facility], [address] [place], [place] [address]     ECG: Date: 10/27/2023  Time ECG obtained: 1312 PM Rate: 83 bpm Rhythm: normal sinus Axis (leads I and aVF): normal Intervals: PR 180 ms. QRS 84 ms. QTc 474 ms. ST segment and T wave changes: No evidence  of acute T wave abnormalities or significant ST segment elevation or depression.  Evidence of a possible, age undetermined, prior infarct:  No Comparison: Similar to previous tracing obtained on 10/31/2020   IMAGING / PROCEDURES: STRESS ECHOCARDIOGRAM performed on 10/01/2023 Normal left ventricular systolic function with an EF of >55% No regional wall motion abnormalities Left ventricular diastolic Doppler parameters consistent with abnormal relaxation (G1DD). Trivial MR and PR Mild TR Mild aortic stenosis; mean transvalvular gradient = 17 mmHg; AVA (VTI) 1.6 cm; DI = 0.5 RVSP = 33 mmHg Maximum workload = 7 METS  CT HEAD AND NECK WO CONTRAST performed on 10/10/2020 No skull fracture.  No intracranial abnormality.  Mild soft tissue swelling of the forehead. No facial fracture.   No fluid in the sinuses. No cervical spine fracture.  No malalignment. Chronic fusion of the spine from C2 through C4 with mild degenerative changes below that.   IMPRESSION AND PLAN: Kinaya Leist has been referred for pre-anesthesia review and clearance prior to her undergoing the planned anesthetic and procedural courses. Available labs, pertinent testing, and imaging results were personally reviewed by me in preparation for upcoming operative/procedural course. [facility] medical record has been updated following extensive record review and patient interview with PAT staff.   This patient has been appropriately cleared by cardiology with an  overall LOW risk of patient experiencing significant perioperative cardiovascular complications. Based on clinical review performed today (10/27/23), barring any significant acute changes in the patient's overall condition, it is anticipated that she will be able to proceed with the planned surgical intervention. Any acute changes in clinical condition may necessitate her procedure being postponed and/or cancelled. Patient will meet with anesthesia team (MD and/or CRNA) on the day of her procedure for preoperative evaluation/assessment. Questions regarding anesthetic course will be fielded at that time.   Pre-surgical instructions were reviewed with the patient during his PAT appointment, and questions were fielded to satisfaction by PAT clinical staff. She has been instructed on which medications that she will need to hold prior to surgery, as well as the ones that have been deemed safe/appropriate to take on the day of her procedure. As part of the general education provided by PAT, patient made aware both verbally and in writing, that she would need to abstain from the use of any illegal substances during her perioperative course. She was advised that failure to follow the provided instructions could necessitate case cancellation or result in serious perioperative complications up to and including death. Patient encouraged to contact PAT and/or her surgeon's office to discuss any questions or concerns that may arise prior to surgery; verbalized understanding.   Courtney Pereyra, MSN, APRN, FNP-C, CEN [facility]  Perioperative Services Nurse Practitioner Phone: [phone] Fax: [phone] 10/27/23 12:20 PM  NOTE: This note has been prepared using Dragon dictation software. Despite my best ability to proofread, there is always the potential that unintentional transcriptional errors may still occur from this process.

## 2023-10-28 ENCOUNTER — Encounter: Payer: Self-pay | Admitting: Obstetrics and Gynecology

## 2023-10-29 ENCOUNTER — Ambulatory Visit: Payer: Self-pay | Admitting: Urgent Care

## 2023-10-29 ENCOUNTER — Encounter
Admission: RE | Disposition: A | Discharge status: Home / Self Care | Payer: Self-pay | Source: Home / Self Care | Attending: Obstetrics and Gynecology

## 2023-10-29 ENCOUNTER — Other Ambulatory Visit: Payer: Self-pay

## 2023-10-29 ENCOUNTER — Observation Stay
Admission: RE | Admit: 2023-10-29 | Discharge: 2023-10-30 | Disposition: A | Discharge status: Home / Self Care | Attending: Obstetrics and Gynecology | Admitting: Obstetrics and Gynecology

## 2023-10-29 ENCOUNTER — Encounter: Payer: Self-pay | Admitting: Obstetrics and Gynecology

## 2023-10-29 DIAGNOSIS — N8500 Endometrial hyperplasia, unspecified: Principal | ICD-10-CM

## 2023-10-29 DIAGNOSIS — Z90710 Acquired absence of both cervix and uterus: Secondary | ICD-10-CM

## 2023-10-29 DIAGNOSIS — Z87891 Personal history of nicotine dependence: Secondary | ICD-10-CM | POA: Insufficient documentation

## 2023-10-29 DIAGNOSIS — Z7982 Long term (current) use of aspirin: Secondary | ICD-10-CM | POA: Insufficient documentation

## 2023-10-29 DIAGNOSIS — E119 Type 2 diabetes mellitus without complications: Secondary | ICD-10-CM | POA: Insufficient documentation

## 2023-10-29 DIAGNOSIS — N9971 Accidental puncture and laceration of a genitourinary system organ or structure during a genitourinary system procedure: Secondary | ICD-10-CM

## 2023-10-29 DIAGNOSIS — R011 Cardiac murmur, unspecified: Secondary | ICD-10-CM

## 2023-10-29 DIAGNOSIS — Z86008 Personal history of in-situ neoplasm of other site: Secondary | ICD-10-CM | POA: Diagnosis not present

## 2023-10-29 DIAGNOSIS — Z79899 Other long term (current) drug therapy: Secondary | ICD-10-CM | POA: Insufficient documentation

## 2023-10-29 DIAGNOSIS — Z7984 Long term (current) use of oral hypoglycemic drugs: Secondary | ICD-10-CM | POA: Diagnosis not present

## 2023-10-29 DIAGNOSIS — Z9889 Other specified postprocedural states: Secondary | ICD-10-CM

## 2023-10-29 DIAGNOSIS — D259 Leiomyoma of uterus, unspecified: Secondary | ICD-10-CM | POA: Diagnosis not present

## 2023-10-29 DIAGNOSIS — E785 Hyperlipidemia, unspecified: Secondary | ICD-10-CM

## 2023-10-29 DIAGNOSIS — N950 Postmenopausal bleeding: Secondary | ICD-10-CM | POA: Diagnosis present

## 2023-10-29 DIAGNOSIS — Z9079 Acquired absence of other genital organ(s): Secondary | ICD-10-CM

## 2023-10-29 DIAGNOSIS — I10 Essential (primary) hypertension: Secondary | ICD-10-CM | POA: Diagnosis not present

## 2023-10-29 DIAGNOSIS — Z794 Long term (current) use of insulin: Secondary | ICD-10-CM | POA: Insufficient documentation

## 2023-10-29 DIAGNOSIS — F1290 Cannabis use, unspecified, uncomplicated: Secondary | ICD-10-CM

## 2023-10-29 DIAGNOSIS — Z01818 Encounter for other preprocedural examination: Secondary | ICD-10-CM

## 2023-10-29 DIAGNOSIS — Z01810 Encounter for preprocedural cardiovascular examination: Secondary | ICD-10-CM

## 2023-10-29 HISTORY — DX: Other cervical disc degeneration, unspecified cervical region: M50.30

## 2023-10-29 HISTORY — DX: Long term (current) use of aspirin: Z79.82

## 2023-10-29 HISTORY — PX: ROBOTIC ASSISTED LAPAROSCOPIC LYSIS OF ADHESION: SHX6080

## 2023-10-29 HISTORY — DX: Other ill-defined heart diseases: I51.89

## 2023-10-29 HISTORY — PX: ROBOTIC ASSISTED TOTAL HYSTERECTOMY WITH BILATERAL SALPINGO OOPHERECTOMY: SHX6086

## 2023-10-29 HISTORY — PX: BLADDER REPAIR: SHX6721

## 2023-10-29 HISTORY — DX: Other forms of dyspnea: R06.09

## 2023-10-29 HISTORY — PX: CYSTOSCOPY: SHX5120

## 2023-10-29 HISTORY — DX: Polyp of colon: K63.5

## 2023-10-29 LAB — ABO/RH: ABO/RH(D): O NEG

## 2023-10-29 LAB — GLUCOSE, CAPILLARY
Glucose-Capillary: 149 mg/dL — ABNORMAL HIGH (ref 70–99)
Glucose-Capillary: 189 mg/dL — ABNORMAL HIGH (ref 70–99)
Glucose-Capillary: 291 mg/dL — ABNORMAL HIGH (ref 70–99)

## 2023-10-29 LAB — TYPE AND SCREEN
ABO/RH(D): O NEG
Antibody Screen: NEGATIVE

## 2023-10-29 SURGERY — HYSTERECTOMY, TOTAL, ROBOT-ASSISTED, LAPAROSCOPIC, WITH BILATERAL SALPINGO-OOPHORECTOMY
Anesthesia: General | Site: Uterus

## 2023-10-29 MED ORDER — ACETAMINOPHEN 500 MG PO TABS
1000.0000 mg | ORAL_TABLET | Freq: Four times a day (QID) | ORAL | Status: DC
  Administered 2023-10-29 – 2023-10-30 (×3): 1000 mg via ORAL
  Filled 2023-10-29 (×3): qty 2

## 2023-10-29 MED ORDER — HYDROMORPHONE HCL 1 MG/ML IJ SOLN
INTRAMUSCULAR | Status: DC | PRN
  Administered 2023-10-29 (×2): .5 mg via INTRAVENOUS

## 2023-10-29 MED ORDER — DEXAMETHASONE SODIUM PHOSPHATE 10 MG/ML IJ SOLN
INTRAMUSCULAR | Status: DC | PRN
  Administered 2023-10-29 (×2): 5 mg via INTRAVENOUS

## 2023-10-29 MED ORDER — PHENYLEPHRINE HCL-NACL 20-0.9 MG/250ML-% IV SOLN
INTRAVENOUS | Status: DC | PRN
  Administered 2023-10-29: 20 ug/min via INTRAVENOUS

## 2023-10-29 MED ORDER — ATORVASTATIN CALCIUM 10 MG PO TABS
10.0000 mg | ORAL_TABLET | Freq: Every day | ORAL | Status: DC
  Administered 2023-10-29: 10 mg via ORAL
  Filled 2023-10-29: qty 1

## 2023-10-29 MED ORDER — PHENYLEPHRINE 80 MCG/ML (10ML) SYRINGE FOR IV PUSH (FOR BLOOD PRESSURE SUPPORT)
PREFILLED_SYRINGE | INTRAVENOUS | Status: AC
  Filled 2023-10-29: qty 10

## 2023-10-29 MED ORDER — PROPOFOL 10 MG/ML IV BOLUS
INTRAVENOUS | Status: AC
  Filled 2023-10-29: qty 20

## 2023-10-29 MED ORDER — PROPOFOL 1000 MG/100ML IV EMUL
INTRAVENOUS | Status: AC
  Filled 2023-10-29: qty 100

## 2023-10-29 MED ORDER — PHENYLEPHRINE HCL-NACL 20-0.9 MG/250ML-% IV SOLN
INTRAVENOUS | Status: AC
  Filled 2023-10-29: qty 250

## 2023-10-29 MED ORDER — ONDANSETRON HCL 4 MG/2ML IJ SOLN
INTRAMUSCULAR | Status: DC | PRN
  Administered 2023-10-29: 4 mg via INTRAVENOUS

## 2023-10-29 MED ORDER — ALBUMIN HUMAN 5 % IV SOLN
INTRAVENOUS | Status: AC
Start: 2023-10-29 — End: 2023-10-29
  Filled 2023-10-29: qty 500

## 2023-10-29 MED ORDER — ACETAMINOPHEN 500 MG PO TABS
ORAL_TABLET | ORAL | Status: AC
  Filled 2023-10-29: qty 2

## 2023-10-29 MED ORDER — SODIUM CHLORIDE 0.9 % IV SOLN: INTRAVENOUS | Status: DC

## 2023-10-29 MED ORDER — GLYCOPYRROLATE 0.2 MG/ML IJ SOLN
INTRAMUSCULAR | Status: DC | PRN
Start: 2023-10-29 — End: 2023-10-29
  Administered 2023-10-29 (×2): .1 mg via INTRAVENOUS

## 2023-10-29 MED ORDER — DEXAMETHASONE SODIUM PHOSPHATE 10 MG/ML IJ SOLN
INTRAMUSCULAR | Status: AC
  Filled 2023-10-29: qty 1

## 2023-10-29 MED ORDER — PROPOFOL 10 MG/ML IV BOLUS
INTRAVENOUS | Status: AC
  Filled 2023-10-29: qty 40

## 2023-10-29 MED ORDER — STERILE WATER FOR IRRIGATION IR SOLN
Status: DC | PRN
  Administered 2023-10-29: 500 mL

## 2023-10-29 MED ORDER — LISINOPRIL 20 MG PO TABS
20.0000 mg | ORAL_TABLET | Freq: Two times a day (BID) | ORAL | Status: DC
  Administered 2023-10-29 – 2023-10-30 (×2): 20 mg via ORAL
  Filled 2023-10-29 (×3): qty 1

## 2023-10-29 MED ORDER — SIMETHICONE 80 MG PO CHEW: 80.0000 mg | CHEWABLE_TABLET | Freq: Four times a day (QID) | ORAL | Status: DC | PRN

## 2023-10-29 MED ORDER — CHLORHEXIDINE GLUCONATE 0.12 % MT SOLN
15.0000 mL | Freq: Once | OROMUCOSAL | Status: AC
  Administered 2023-10-29: 15 mL via OROMUCOSAL

## 2023-10-29 MED ORDER — LACTATED RINGERS IV SOLN: INTRAVENOUS | Status: DC | PRN

## 2023-10-29 MED ORDER — CHLORHEXIDINE GLUCONATE 0.12 % MT SOLN
OROMUCOSAL | Status: AC
Start: 2023-10-29 — End: 2023-10-29
  Filled 2023-10-29: qty 15

## 2023-10-29 MED ORDER — LIDOCAINE HCL (PF) 2 % IJ SOLN
INTRAMUSCULAR | Status: AC
  Filled 2023-10-29: qty 5

## 2023-10-29 MED ORDER — CEFAZOLIN SODIUM-DEXTROSE 2-4 GM/100ML-% IV SOLN
INTRAVENOUS | Status: AC
  Filled 2023-10-29: qty 100

## 2023-10-29 MED ORDER — ROCURONIUM BROMIDE 100 MG/10ML IV SOLN
INTRAVENOUS | Status: DC | PRN
  Administered 2023-10-29: 30 mg via INTRAVENOUS
  Administered 2023-10-29: 60 mg via INTRAVENOUS
  Administered 2023-10-29: 30 mg via INTRAVENOUS

## 2023-10-29 MED ORDER — ORAL CARE MOUTH RINSE: 15.0000 mL | Freq: Once | OROMUCOSAL | Status: AC

## 2023-10-29 MED ORDER — CHLORHEXIDINE GLUCONATE CLOTH 2 % EX PADS: 6.0000 | MEDICATED_PAD | Freq: Every day | CUTANEOUS | Status: DC

## 2023-10-29 MED ORDER — OXYCODONE HCL 5 MG/5ML PO SOLN: 5.0000 mg | Freq: Once | ORAL | Status: AC | PRN

## 2023-10-29 MED ORDER — INSULIN ASPART 100 UNIT/ML IJ SOLN
8.0000 [IU] | Freq: Once | INTRAMUSCULAR | Status: AC
  Administered 2023-10-29: 8 [IU] via SUBCUTANEOUS
  Filled 2023-10-29: qty 1

## 2023-10-29 MED ORDER — ENOXAPARIN SODIUM 40 MG/0.4ML IJ SOSY
40.0000 mg | PREFILLED_SYRINGE | INTRAMUSCULAR | Status: DC
  Administered 2023-10-30: 40 mg via SUBCUTANEOUS
  Filled 2023-10-29: qty 0.4

## 2023-10-29 MED ORDER — BUPIVACAINE HCL (PF) 0.5 % IJ SOLN
INTRAMUSCULAR | Status: AC
  Filled 2023-10-29: qty 30

## 2023-10-29 MED ORDER — FENTANYL CITRATE (PF) 100 MCG/2ML IJ SOLN
INTRAMUSCULAR | Status: DC | PRN
  Administered 2023-10-29: 100 ug via INTRAVENOUS

## 2023-10-29 MED ORDER — FENTANYL CITRATE (PF) 100 MCG/2ML IJ SOLN: 25.0000 ug | INTRAMUSCULAR | Status: DC | PRN

## 2023-10-29 MED ORDER — PROPOFOL 10 MG/ML IV BOLUS
INTRAVENOUS | Status: DC | PRN
  Administered 2023-10-29: 150 mg via INTRAVENOUS

## 2023-10-29 MED ORDER — LACTATED RINGERS IV SOLN
INTRAVENOUS | Status: DC | PRN
Start: 2023-10-29 — End: 2023-10-29

## 2023-10-29 MED ORDER — PROPOFOL 500 MG/50ML IV EMUL
INTRAVENOUS | Status: DC | PRN
Start: 2023-10-29 — End: 2023-10-29
  Administered 2023-10-29: 100 ug/kg/min via INTRAVENOUS

## 2023-10-29 MED ORDER — ONDANSETRON HCL 4 MG/2ML IJ SOLN
4.0000 mg | Freq: Four times a day (QID) | INTRAMUSCULAR | Status: DC | PRN
Start: 2023-10-29 — End: 2023-10-30

## 2023-10-29 MED ORDER — INSULIN ASPART 100 UNIT/ML IJ SOLN
0.0000 [IU] | Freq: Three times a day (TID) | INTRAMUSCULAR | Status: DC
  Administered 2023-10-30: 2 [IU] via SUBCUTANEOUS
  Filled 2023-10-29: qty 1

## 2023-10-29 MED ORDER — IBUPROFEN 600 MG PO TABS
600.0000 mg | ORAL_TABLET | Freq: Four times a day (QID) | ORAL | Status: DC
  Administered 2023-10-29 – 2023-10-30 (×3): 600 mg via ORAL
  Filled 2023-10-29 (×3): qty 1

## 2023-10-29 MED ORDER — POLYETHYLENE GLYCOL 3350 17 G PO PACK: 17.0000 g | PACK | Freq: Every day | ORAL | Status: DC | PRN

## 2023-10-29 MED ORDER — OXYCODONE HCL 5 MG PO TABS
ORAL_TABLET | ORAL | Status: AC
  Filled 2023-10-29: qty 1

## 2023-10-29 MED ORDER — POVIDONE-IODINE 10 % EX SWAB
2.0000 | Freq: Once | CUTANEOUS | Status: AC
  Administered 2023-10-29: 2 via TOPICAL

## 2023-10-29 MED ORDER — SUGAMMADEX SODIUM 200 MG/2ML IV SOLN
INTRAVENOUS | Status: DC | PRN
  Administered 2023-10-29: 200 mg via INTRAVENOUS

## 2023-10-29 MED ORDER — EPHEDRINE 5 MG/ML INJ
INTRAVENOUS | Status: AC
  Filled 2023-10-29: qty 5

## 2023-10-29 MED ORDER — MIDAZOLAM HCL 2 MG/2ML IJ SOLN
INTRAMUSCULAR | Status: AC
  Filled 2023-10-29: qty 2

## 2023-10-29 MED ORDER — BUPIVACAINE HCL (PF) 0.5 % IJ SOLN
INTRAMUSCULAR | Status: DC | PRN
  Administered 2023-10-29: 20 mL

## 2023-10-29 MED ORDER — 0.9 % SODIUM CHLORIDE (POUR BTL) OPTIME
TOPICAL | Status: DC | PRN
Start: 2023-10-29 — End: 2023-10-29
  Administered 2023-10-29: 500 mL

## 2023-10-29 MED ORDER — SODIUM CHLORIDE 0.9 % IR SOLN
Status: DC | PRN
  Administered 2023-10-29: 1000 mL

## 2023-10-29 MED ORDER — LIDOCAINE HCL (CARDIAC) PF 100 MG/5ML IV SOSY
PREFILLED_SYRINGE | INTRAVENOUS | Status: DC | PRN
  Administered 2023-10-29: 100 mg via INTRAVENOUS

## 2023-10-29 MED ORDER — ENOXAPARIN SODIUM 40 MG/0.4ML IJ SOSY
40.0000 mg | PREFILLED_SYRINGE | INTRAMUSCULAR | 0 refills | Status: AC
Start: 2023-10-29 — End: 2023-11-05

## 2023-10-29 MED ORDER — ONDANSETRON HCL 4 MG/2ML IJ SOLN
INTRAMUSCULAR | Status: AC
  Filled 2023-10-29: qty 2

## 2023-10-29 MED ORDER — ONDANSETRON HCL 4 MG PO TABS: 4.0000 mg | ORAL_TABLET | Freq: Four times a day (QID) | ORAL | Status: DC | PRN

## 2023-10-29 MED ORDER — CEFAZOLIN SODIUM-DEXTROSE 2-4 GM/100ML-% IV SOLN
2.0000 g | INTRAVENOUS | Status: AC
  Administered 2023-10-29: 2 g via INTRAVENOUS

## 2023-10-29 MED ORDER — ALBUMIN HUMAN 5 % IV SOLN: INTRAVENOUS | Status: DC | PRN

## 2023-10-29 MED ORDER — ROCURONIUM BROMIDE 10 MG/ML (PF) SYRINGE
PREFILLED_SYRINGE | INTRAVENOUS | Status: AC
  Filled 2023-10-29: qty 10

## 2023-10-29 MED ORDER — FENTANYL CITRATE (PF) 100 MCG/2ML IJ SOLN
INTRAMUSCULAR | Status: AC
Start: 2023-10-29 — End: 2023-10-29
  Filled 2023-10-29: qty 2

## 2023-10-29 MED ORDER — HYDROMORPHONE HCL 1 MG/ML IJ SOLN
INTRAMUSCULAR | Status: AC
  Filled 2023-10-29: qty 1

## 2023-10-29 MED ORDER — EPHEDRINE SULFATE-NACL 50-0.9 MG/10ML-% IV SOSY
PREFILLED_SYRINGE | INTRAVENOUS | Status: DC | PRN
  Administered 2023-10-29: 5 mg via INTRAVENOUS
  Administered 2023-10-29: 10 mg via INTRAVENOUS

## 2023-10-29 MED ORDER — ACETAMINOPHEN 500 MG PO TABS
1000.0000 mg | ORAL_TABLET | ORAL | Status: AC
  Administered 2023-10-29: 1000 mg via ORAL

## 2023-10-29 MED ORDER — OXYCODONE HCL 5 MG PO TABS
5.0000 mg | ORAL_TABLET | Freq: Once | ORAL | Status: AC | PRN
  Administered 2023-10-29: 5 mg via ORAL

## 2023-10-29 MED ORDER — OXYCODONE HCL 5 MG PO TABS
5.0000 mg | ORAL_TABLET | ORAL | Status: DC | PRN
  Administered 2023-10-29 – 2023-10-30 (×2): 5 mg via ORAL
  Filled 2023-10-29 (×3): qty 1

## 2023-10-29 MED ORDER — HEMOSTATIC AGENTS (NO CHARGE) OPTIME
TOPICAL | Status: DC | PRN
  Administered 2023-10-29: 1 via TOPICAL

## 2023-10-29 MED ORDER — GLYCOPYRROLATE 0.2 MG/ML IJ SOLN
INTRAMUSCULAR | Status: AC
  Filled 2023-10-29: qty 1

## 2023-10-29 MED ORDER — METRONIDAZOLE 500 MG/100ML IV SOLN
500.0000 mg | Freq: Once | INTRAVENOUS | Status: AC
  Administered 2023-10-29: 500 mg via INTRAVENOUS
  Filled 2023-10-29: qty 100

## 2023-10-29 SURGICAL SUPPLY — 65 items
BAG PRESSURE INF REUSE 1000 (BAG) IMPLANT
BAG URINE DRAIN 2000ML AR STRL (UROLOGICAL SUPPLIES) ×4 IMPLANT
BAG URINE LEG 25OZ (MISCELLANEOUS) IMPLANT
BLADE SURG SZ11 CARB STEEL (BLADE) ×4 IMPLANT
CANNULA CAP OBTURATR AIRSEAL 8 (CAP) ×4 IMPLANT
CATH URTH 16FR FL 2W BLN LF (CATHETERS) ×4 IMPLANT
CHLORAPREP W/TINT 26 (MISCELLANEOUS) IMPLANT
COUNTER NDL MAGNETIC 40 RED (SET/KITS/TRAYS/PACK) ×4 IMPLANT
COUNTER NEEDLE MAGNETIC 40 RED (SET/KITS/TRAYS/PACK) ×4 IMPLANT
COVER LIGHT HANDLE STERIS (MISCELLANEOUS) IMPLANT
COVER TIP SHEARS 8 DVNC (MISCELLANEOUS) ×4 IMPLANT
DEFOGGER SCOPE WARM SEASHARP (MISCELLANEOUS) IMPLANT
DERMABOND ADVANCED .7 DNX12 (GAUZE/BANDAGES/DRESSINGS) IMPLANT
DRAIN CHANNEL JP 15F RND 3/16 (MISCELLANEOUS) IMPLANT
DRAPE ARM DVNC X/XI (DISPOSABLE) ×16 IMPLANT
DRAPE COLUMN DVNC XI (DISPOSABLE) ×4 IMPLANT
DRAPE SHEET LG 3/4 BI-LAMINATE (DRAPES) ×4 IMPLANT
DRIVER NDL LRG 8 DVNC XI (INSTRUMENTS) IMPLANT
DRIVER NDL MEGA 8 DVNC XI (INSTRUMENTS) ×4 IMPLANT
DRIVER NDL MEGA SUTCUT DVNCXI (INSTRUMENTS) ×4 IMPLANT
DRIVER NDLE LRG 8 DVNC XI (INSTRUMENTS) ×8 IMPLANT
DRIVER NDLE MEGA DVNC XI (INSTRUMENTS) ×4 IMPLANT
DRIVER NDLE MEGA SUTCUT DVNCXI (INSTRUMENTS) ×4 IMPLANT
DRSG TEGADERM 2-3/8X2-3/4 SM (GAUZE/BANDAGES/DRESSINGS) ×16 IMPLANT
ELECTRODE REM PT RTRN 9FT ADLT (ELECTROSURGICAL) ×4 IMPLANT
EVACUATOR SILICONE 100CC (DRAIN) IMPLANT
FORCEPS BPLR FENES DVNC XI (FORCEP) ×4 IMPLANT
GAUZE 4X4 16PLY ~~LOC~~+RFID DBL (SPONGE) ×4 IMPLANT
GAUZE SPONGE 2X2 STRL 8-PLY (GAUZE/BANDAGES/DRESSINGS) ×8 IMPLANT
GLOVE SURG SYN 6.5 PF PI (GLOVE) ×16 IMPLANT
GOWN STRL REUS W/ TWL LRG LVL3 (GOWN DISPOSABLE) ×16 IMPLANT
IRRIGATION STRYKERFLOW (MISCELLANEOUS) IMPLANT
IRRIGATOR SUCT 8 DISP DVNC XI (IRRIGATION / IRRIGATOR) IMPLANT
IV NS 1000ML BAXH (IV SOLUTION) IMPLANT
KIT PINK PAD W/HEAD ARM REST (MISCELLANEOUS) ×4 IMPLANT
LABEL OR SOLS (LABEL) ×4 IMPLANT
MANIFOLD NEPTUNE II (INSTRUMENTS) ×4 IMPLANT
MANIPULATOR VCARE LG CRV RETR (MISCELLANEOUS) IMPLANT
MANIPULATOR VCARE SML CRV RETR (MISCELLANEOUS) IMPLANT
MANIPULATOR VCARE STD CRV RETR (MISCELLANEOUS) IMPLANT
NDL DRIVE SUT CUT DVNC (INSTRUMENTS) IMPLANT
NEEDLE DRIVE SUT CUT DVNC (INSTRUMENTS) IMPLANT
NS IRRIG 1000ML POUR BTL (IV SOLUTION) ×4 IMPLANT
OBTURATOR OPTICALSTD 8 DVNC (TROCAR) ×4 IMPLANT
OCCLUDER COLPOPNEUMO (BALLOONS) ×4 IMPLANT
PACK GYN LAPAROSCOPIC (MISCELLANEOUS) ×4 IMPLANT
PAD OB MATERNITY 11 LF (PERSONAL CARE ITEMS) ×4 IMPLANT
PAD PREP OB/GYN DISP 24X41 (PERSONAL CARE ITEMS) ×4 IMPLANT
POWDER SURGICEL 3.0 GRAM (HEMOSTASIS) IMPLANT
SCRUB CHG 4% DYNA-HEX 4OZ (MISCELLANEOUS) ×4 IMPLANT
SEAL UNIV 5-12 XI (MISCELLANEOUS) ×12 IMPLANT
SEALER VESSEL EXT DVNC XI (MISCELLANEOUS) IMPLANT
SET CYSTO W/LG BORE CLAMP LF (SET/KITS/TRAYS/PACK) IMPLANT
SET TUBE FILTERED XL AIRSEAL (SET/KITS/TRAYS/PACK) ×4 IMPLANT
SOLUTION ELECTROSURG ANTI STCK (MISCELLANEOUS) ×4 IMPLANT
SPONGE DRAIN TRACH 4X4 STRL 2S (GAUZE/BANDAGES/DRESSINGS) IMPLANT
SURGILUBE 2OZ TUBE FLIPTOP (MISCELLANEOUS) ×4 IMPLANT
SUT ETHILON 3-0 (SUTURE) IMPLANT
SUT STRATA 3-0 15 RB-1 (SUTURE) IMPLANT
SUT STRATAFIX SPIRAL PDS+ 0 30 (SUTURE) ×4 IMPLANT
SUTURE MNCRL 4-0 27XMF (SUTURE) ×4 IMPLANT
SYR 50ML LL SCALE MARK (SYRINGE) ×4 IMPLANT
SYRINGE TOOMEY IRRIG 70ML (MISCELLANEOUS) IMPLANT
TIP ENDOSCOPIC SURGICEL (TIP) IMPLANT
WATER STERILE IRR 500ML POUR (IV SOLUTION) ×4 IMPLANT

## 2023-10-29 NOTE — Anesthesia Procedure Notes (Signed)
 Procedure Name: Intubation Date/Time: 10/29/2023 10:44 AM  Performed by: Marquise Wicke, CRNAPre-anesthesia Checklist: Patient identified, Emergency Drugs available, Suction available and Patient being monitored Patient Re-evaluated:Patient Re-evaluated prior to induction Oxygen Delivery Method: Circle System Utilized Preoxygenation: Pre-oxygenation with 100% oxygen Induction Type: IV induction Ventilation: Mask ventilation without difficulty Laryngoscope Size: Mac and 3 Grade View: Grade I Tube type: Oral Tube size: 7.0 mm Number of attempts: 1 Airway Equipment and Method: Stylet and Oral airway Placement Confirmation: ETT inserted through vocal cords under direct vision, positive ETCO2 and breath sounds checked- equal and bilateral Secured at: 22 cm Tube secured with: Tape Dental Injury: Teeth and Oropharynx as per pre-operative assessment  Comments: Lips, teeth and tongue unchanged. Head and neck midline. Easy, atraumatic intubation,.

## 2023-10-29 NOTE — Op Note (Signed)
 Preoperative diagnosis: Endometrial hyperplasia, Post menopausal bleeding                                           Intestinal adhesions  Postoperative diagnosis: Same  Procedure: Robotic assisted laparoscopic enterolysis.   Anesthesia: GETA  Surgeon: Dr. Rodolph, MD   Indications: Patient is a 73 y.o. female with postmenopausal bleeding and endometrial hyperplasia that needed hysterectomy.  GYN service identified significant adhesions from the sigmoid colon to the uterus and requested intraoperative consult for lysis of additions  Findings: 1.  Abundant lysis of adhesion from sigmoid colon to uterus 2.  Foley catheter exposed from intra-abdominal he before I started my procedure 3.  No bowel injury identified during my procedure  Description of procedure:  Intraoperative consult for lysis of adhesions.  Bipolar forcep and scissors were already inserted.  On the posterior portion of the uterus there was adhesions from the sigmoid colon.  These were lysed sharply with scissors.  I mobilized the sigmoid colon until Dr. Lovetta was comfortable that she was gena be able to complete the hysterectomy.  No injury to intestine was identified during my procedure.  I left the bipolar instrument and scissors and return the procedure back to GYN.  Urology was consulted for repair of bladder.  Specimen: None  Complications: None from the lysis of adhesion procedure.  Estimated Blood Loss: Minimal

## 2023-10-29 NOTE — Discharge Instructions (Addendum)
 You had a robotic assisted total laparoscopic hysterectomy, bilateral salpingo-oophorectomy, and cystoscopy (your uterus, cervix, fallopian tubes, and ovaries were removed).   The things you should look out for include heavy vaginal bleeding (soaking more than 1 pad an hour), fever (Temperature equal to or greater than 100.4), severe abdominal pain, or severe nausea and vomiting- not able to keep food down. If any of these things happen, you should come to the emergency room. Just a reminder- nothing in the vagina (no tampons or intercourse) for 8 weeks- this includes no bathes and swimming. But you should take regular showers. Take all of your medicines as prescribed.

## 2023-10-29 NOTE — Interval H&P Note (Signed)
 History and Physical Interval Note:  10/29/2023 10:25 AM  Shaconda Taketa  has presented today for surgery, with the diagnosis of postmenopausal bleeding endometrial hyperplasia.  The various methods of treatment have been discussed with the patient and family. After consideration of risks, benefits and other options for treatment, the patient has consented to  Procedure(s): HYSTERECTOMY, TOTAL, ROBOT-ASSISTED, LAPAROSCOPIC, WITH BILATERAL SALPINGO-OOPHORECTOMY (Bilateral) CYSTOSCOPY (N/A) as a surgical intervention.  The patient's history has been reviewed, patient examined, no change in status, stable for surgery.  I have reviewed the patient's chart and labs.  Questions were answered to the patient's satisfaction.     [name]

## 2023-10-29 NOTE — Op Note (Signed)
 OPERATIVE NOTE   DATE OF SERVICE:  10/29/2023 NAME: Courtney Sparks MRN: [id number]   PREOPERATIVE DIAGNOSES: Endometrial hyperplasia, postmenopausal bleeding, endometrial polyp   POSTOPERATIVE DIAGNOSES: Endometrial hyperplasia, postmenopausal bleeding, endometrial polyp, intra-abdominal adhesions, diverticulosis, s/p repair of cystotomy    PROCEDURE: Robot assisted total laparoscopic hysterectomy, bilateral salpingo-oophorectomy, lysis of adhesions, placement of JP drain, cystoscopy    ANESTHESIA: GETA   SURGEON:  Beverli Dinsmore, MD ASSISTANT: Garnette Mace, MD   Findings: Small mobile uterus- normal appearing, normal appearing bilateral fallopian tubes and ovaries. Significant adhesions of large bowel to pelvic sidewalls. Sigmoid adhered along the entire left pelvic side wall and into the posterior cul-de-sac. Diverticula seen throughout the colon. No other adhesions noted.  All tissue friable and bled upon light palpation. Peritoneum and omentum normal to the extent seen.  Liver cirrhotic in appearance. Cystoscopy with brisk bilateral efflux from ureteral jets. Suture from cystotomy repair visible but no other defects, suture, erythema or other abnormalities in the bladder mucosa.   Estimated blood loss in the OR: 75cc   Fluids: 700cc   UOP: 300cc   COMPLICATIONS: Cystotomy    SPECIMENS: Uterus, cervix, bilateral ovaries, and bilateral fallopian tubes   DESCRIPTION OF PROCEDURE: Patient was taken to the OR, where general anesthesia was induced. Patient was placed in a dorsal lithotomy position.  Abdomen and vagina were prepped and draped in the usual sterile fashion.  A transurethral Foley catheter was placed. A speculum was placed and the cervix visualized. A tenaculum was placed on the cervix and the cervix was dilated to accommodate a Orthoptist. This was placed on the cervix and all other vaginal instruments were removed without difficulty. Surgeon's gloves were  changed and attention was turned to the abdomen.    First, 5cc of 0.5% Marcaine  was injected at the intended supraumbilical incision site. A 5 mm incision was made and using direct visualization the peritoneal cavity was entered. The cavity was insufflated with CO2 gas and the laparoscope reinserted confirming abdominal entry.  The pressure was set at 15 mm Hg. Another 5cc of 0.5% Marcaine  was injected at intended incision site- 10 cm lateral on the left. An 8mm left lateral incision was made and robot port was placed. The same was performed approximately 10cm laterally on the right. The patient was placed in 26 degrees of Trendelenburg and the peritoneal contents were inspected with the above-noted findings. The robot was docked with monopolar scissors in the right hand and a bipolar foreceps in the left hand.    The surgeon went to the surgeon console where the surgery was continued. Significant bowel adhesions noted that prevented access to the left pelvic side wall. These adhesions were taken down with a combination of sharp and blunt dissection and monopolar cautery with care taken to ensure remaining a safe distance from bowel. Still unable to visualize entire pelvic side wall and therefore unable to visualize left ureter. Bleeding was noted from the sidewall and the infundibulopelvic ligament, so decision made to proceed. An avascular area of the left mesosalpinx was identified and a window was made. The left infundibulopelvic ligament was skeletonized and then cauterized with bipolar cautery and cut with monopolar scissors. Due to significant adhesions of the sigmoid to the left pelvic sidewall, posterior uterus, and posterior cul-de-sac, general surgery was called for assistance.   Attention turned to the right side while waiting for general surgery. The right ureter was identified. An avascular area of the right mesosalpinx was identified and a window was  made. The right infundibulopelvic ligament  was skeletonized and then cauterized with bipolar cautery and cut with monopolar scissors. The right round ligament was then then ligated and transected. The right broad ligament was then dissected down to the level of the internal os. A bladder flap was then created and the bladder bluntly pushed down until safely away from the cervix. The right uterine artery was skeletonized, cauterized, and cut.   Attention returned to the left side. The left round ligament was then then ligated and transected. The left broad ligament was then dissected down to the level of the internal os. While sharply dissecting down the left broad ligament, a portion of the foley bulb was suddenly visible at the left lateral aspect of the anterior abdominal wall and a cystotomy was confirmed. Urology was called for assistance. Urology confirmed a 1 cm cystotomy at the left bladder dome. They recommended completion of hysterectomy followed by bladder repair.    The colpotomy incision was started on the right sie and taken posteriorly and around the complete circumference until the specimen was completely detached and taken out through the vagina. Hemostasis noted.    Due to the distorted anatomy, unable to definitely determine the bladder edges, so decision made to postpone cuff closure until after bladder repair. Dr. Georganne, urologist, took over for the bladder repair. See separate operative note for details. Once the bladder was repaired, I returned to the console to close the vaginal cuff. The vaginal cuff was closed using a running 0 Stratafix barbed suture careful to incorporate the peritoneum; two layer closure was used. The pelvis was irrigated. Hemostasis was noted. Surgicel powder was placed over the cuff. The pressure was dropped to and continued hemostasis was noted. A JP drain was introduced through the right trocar. The end of the drain was guided into the posterior cul-de-sac. Hemostasis was observed. The remainder  of the robotic instruments were removed. The robot was undocked. The abdomen was deflated and trocars were removed using a Q tip. The drain was sutured into place and the remaining part of the incision was closed with subcuticular 4-0 Monocryl in a running fashion. The skin at the other 2 port sites was closed with subcuticular 4-0 Monocryl in a running fashion. The incisions were covered with 2x2 and Tegaderm dressings.   The foley catheter was removed. Transurethral bladder cystoscopy was performed. Brisk efflux from bilateral UOs noted. Suture from cystotomy repair visible but no other defects, suture, erythema or other abnormalities in the bladder mucosa. The foley catheter was replaced.    The vagina was inspected to ensure no vaginal lacerations present. The cuff was noted to be intact and hemostatic. All instruments were removed.    The patient tolerated the procedure well.  All counts were correct. General anesthesia was reversed and patient was extubated. Patient was in stable condition at time of MD leaving OR.    Beverli LULLA Dinsmore, MD, 10/29/2023, 2:10 PM

## 2023-10-29 NOTE — Op Note (Signed)
 Date of procedure: 10/29/23  Preoperative diagnosis:  Endometrial bleeding, hysterectomy  Postoperative diagnosis:  Bladder injury <2cm, intraoperative   Procedure: Robotic cystorrhaphy < 2cm (48179)  Surgeon: Penne Skye, MD  Anesthesia: General  Complications: None  Intraoperative findings: ~1-2 cm sharp cystotomy at Right bladder dome. Closed primarily with 3-0 Stratafix in single layer. Watertight at 200cc leak test. Bilateral ureters uninvolved.   Indication: Urology consulted for intraoperative bladder injury noted during robotic TAH  Description of procedure:  Urology consulted intraoperatively for a concern of inadvertent cystotomy during robotic TAH.  Case was underway during evaluation, supine lithotomy positioning, robotic approach.  Hysterectomy performed, vaginal closure not yet completed.  During our evaluation, bilateral pelvic brim was interrogated for any entry into the retroperitoneum and suspicion for ureteral involvement. Some Right colonic LOA had occurred, but the retroperitoneum was unviolated.  The bladder was backfilled with sterile water  via pre-existing Foley catheter.  A water  leak and a small cystotomy was noted at the right anterior bladder dome.  The cystotomy was approximately 1 to 2 cm with sharp noncauterized margins.  The intravesical mucosa was inspected and the cystotomy was noted to be well away from the trigone or bilateral ureteral orifices.  The small cystotomy was closed in a single layer with a running 3-0 STRATAFIX suture.  An additional single interrupted stitch was used to reapproximate the overlying serosa.  A new 73F catheter was placed w/ 10cc in balloon. The bladder was backfilled with 200 mL of sterile water  and the closure was felt to be watertight.  At this point the case was returned to the gynecology team   Plan: - Recommend JP drain placement x 3 days - Follow up with Urology on Monday for JP removal - Anticipate 7-10 indwelling  catheter, TOV w/ Urology   Penne Skye, MD

## 2023-10-29 NOTE — Anesthesia Preprocedure Evaluation (Signed)
 Anesthesia Evaluation  Patient identified by MRN, date of birth, ID band Patient awake    Reviewed: Allergy & Precautions, NPO status , Patient's Chart, lab work & pertinent test results  History of Anesthesia Complications (+) PONV and history of anesthetic complications  Airway Mallampati: III  TM Distance: >3 FB Neck ROM: full    Dental no notable dental hx.    Pulmonary neg pulmonary ROS, former smoker   Pulmonary exam normal        Cardiovascular hypertension, On Medications Normal cardiovascular exam+ Valvular Problems/Murmurs AS   ECHO 2024  INTERPRETATION  NORMAL LEFT VENTRICULAR SYSTOLIC FUNCTION   WITH MILD LVH  NORMAL RIGHT VENTRICULAR SYSTOLIC FUNCTION  MILD VALVULAR REGURGITATION (See above)  MILD VALVULAR STENOSIS (See above)  ESTIMATED LVEF >55%  MILD AS: Max Vel: 2.34m/s: AVA: 1.5cm^2   Normal recent stress test    Neuro/Psych negative neurological ROS  negative psych ROS   GI/Hepatic Neg liver ROS,GERD  Medicated,,  Endo/Other  negative endocrine ROSdiabetes    Renal/GU negative Renal ROS  negative genitourinary   Musculoskeletal   Abdominal   Peds  Hematology negative hematology ROS (+)   Anesthesia Other Findings Past Medical History: No date: Aortic stenosis No date: Colon polyp No date: Complication of anesthesia No date: DDD (degenerative disc disease), cervical     Comment:  a.) s/p fusion C2-C4 No date: Diastolic dysfunction No date: DM (diabetes mellitus), type 2 (HCC) No date: Dyspnea on exertion No date: Endometrial hyperplasia No date: GERD (gastroesophageal reflux disease) No date: Heart murmur 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 No date: Hyperlipidemia No date: Hypertension No date: Long-term use of aspirin therapy No date: Marijuana use No date: NASH (nonalcoholic steatohepatitis) No date: Obesity No date: PMB (postmenopausal bleeding) No date: PONV (postoperative nausea and  vomiting) No date: Squamous cell skin cancer  Past Surgical History: 2010?: BREAST BIOPSY; Left     Comment:  core bx-neg No date: CERVICAL FUSION; N/A     Comment:  C2-C4 04/08/2021: COLONOSCOPY; N/A     Comment:  Procedure: COLONOSCOPY;  Surgeon: Maryruth Ole DASEN,               MD;  Location: [facility];  Service: Endoscopy;                Laterality: N/A;  DM No date: COLONOSCOPY WITH PROPOFOL  10/13/2022: COLONOSCOPY WITH PROPOFOL ; N/A     Comment:  Procedure: COLONOSCOPY WITH PROPOFOL ;  Surgeon:               Maryruth Ole DASEN, MD;  Location: [facility] ENDOSCOPY;                Service: Endoscopy;  Laterality: N/A; No date: MANDIBLE RECONSTRUCTION 10/13/2022: POLYPECTOMY     Comment:  Procedure: POLYPECTOMY;  Surgeon: Maryruth Ole DASEN,               MD;  Location: [facility] ENDOSCOPY;  Service: Endoscopy;; No date: TUBAL LIGATION     Reproductive/Obstetrics negative OB ROS                              Anesthesia Physical Anesthesia Plan  ASA: 3  Anesthesia Plan: General ETT   Post-op Pain Management: Toradol IV (intra-op)*, Ofirmev  IV (intra-op)* and Dilaudid  IV   Induction: Intravenous  PONV Risk Score and Plan: 3 and Ondansetron , Propofol  infusion, TIVA, Dexamethasone  and Treatment may vary due to age or medical condition  Airway Management Planned: Oral ETT  Additional Equipment:   Intra-op Plan:   Post-operative Plan: Extubation in OR  Informed Consent: I have reviewed the patients History and Physical, chart, labs and discussed the procedure including the risks, benefits and alternatives for the proposed anesthesia with the patient or authorized representative who has indicated his/her understanding and acceptance.     Dental Advisory Given  Plan Discussed with: Anesthesiologist, CRNA and Surgeon  Anesthesia Plan Comments: (Patient consented for risks of anesthesia including but not limited to:  - adverse reactions to  medications - damage to eyes, teeth, lips or other oral mucosa - nerve damage due to positioning  - sore throat or hoarseness - Damage to heart, brain, nerves, lungs, other parts of body or loss of life  Patient voiced understanding and assent.)         Anesthesia Quick Evaluation

## 2023-10-29 NOTE — Plan of Care (Signed)
 GYN POST OP PROGRESS NOTE Courtney Sparks [id number] 10/29/2023  Subjective   Patient and family decided they were more comfortable staying overnight. Admitted overnight for observation. Doing well. Denies pain. Just ate a veggie burger without nausea or vomiting. Denies chest pain and SOB. Foley and JP drain both in place and draining.    Objective   Vitals:   10/29/23 1515 10/29/23 1540 10/29/23 1700 10/29/23 1738  BP: 118/64 135/72 133/63 (!) 144/72  Pulse: 66 75 86 89  Resp: 12 20 16 18   Temp: (!) 97 F (36.1 C) 97.9 F (36.6 C) (!) 96.8 F (36 C)   TempSrc:  Temporal    SpO2: 97% 99% 96% 96%  Weight:      Height:        Intake/Output Summary (Last 24 hours) at 10/29/2023 1914 Last data filed at 10/29/2023 1745 Gross per 24 hour  Intake 1240 ml  Output 685 ml  Net 555 ml    Gen: NAD, A&O x 3 CV: Regular rate Lungs: Non-labored breathing Abd: Soft, non-tender, nondistended Ext: warm, SCDs on, no BLE edema Incision: c/d/i, 2x2 and tegaderm in place with some dried blood   Drains: Foley in place- yellow urine in bag Jp drain in place- serosanginous fluid in bulb   Labs: Recent Labs    10/27/23 1304  WBC 6.4  HGB 12.6  HCT 37.8  MCV 84.0  PLT 118*   Recent Labs    10/27/23 1304  NA 139  K 3.8  CL 106  CO2 21*  CREATININE 0.45  BUN 12  CALCIUM  9.3  ALT 36  AST 49*    Assessment & Plan  Courtney Sparks is a 73 y.o. F POD0 from a RA TLH BSO, LOA, placement of JP drain, cystoscopy, and repair of cystotomy.    - FEN: Regular diet. - Pain: ERAS protocol. Scheduled Tylenol  and ibuprofen . PRN Oxycodone . - Heme: Hb 12.6 pre-op > AM  - CV: VSS - HTN: Home lisinopril  ordered.  - HLD: home atorvastatin  continued - Resp: encourage incentive spirometry - GI: Scheduled bowel regimen, antiemetics PRN. JP drain in place with bulb to suction.  - GU: foley to remain in place- adequate output. Patient will follow-up with Dr. Georganne on 9/8.  - Endo: T2DM- metformin  held, SSI PRN with TID AC/HS glucose checks - Renal: Cr 0.45 pre-op > AM - Ppx: PPI, encourage ambulation, SCD's, lovenox  40 mg to begin in AM   Disposition: plan for discharge home tomorrow   Electronically signed by:  Beverli LULLA Dinsmore, MD, 10/29/2023, 7:14 PM

## 2023-10-29 NOTE — Transfer of Care (Signed)
 Immediate Anesthesia Transfer of Care Note  Patient: Courtney Sparks  Procedure(s) Performed: HYSTERECTOMY, TOTAL, ROBOT-ASSISTED, LAPAROSCOPIC, WITH BILATERAL SALPINGO-OOPHORECTOMY (Bilateral: Uterus) CYSTOSCOPY (Bladder) LYSIS, ADHESIONS, ROBOT-ASSISTED, LAPAROSCOPIC (Pelvis) ROBOTIC ASSISTED LAPAROSCOPIC CYSTORRHAPHY (Bladder)  Patient Location: PACU  Anesthesia Type:General  Level of Consciousness: drowsy  Airway & Oxygen Therapy: Patient Spontanous Breathing and Patient connected to face mask oxygen  Post-op Assessment: Report given to RN and Post -op Vital signs reviewed and stable  Post vital signs: Reviewed and stable  Last Vitals:  Vitals Value Taken Time  BP 107/87 10/29/23 14:22  Temp    Pulse 70 10/29/23 14:24  Resp 16 10/29/23 14:24  SpO2 98 % 10/29/23 14:24  Vitals shown include unfiled device data.  Last Pain:  Vitals:   10/29/23 1003  TempSrc: Temporal  PainSc: 0-No pain         Complications: No notable events documented.

## 2023-10-30 ENCOUNTER — Encounter: Payer: Self-pay | Admitting: Obstetrics and Gynecology

## 2023-10-30 DIAGNOSIS — D259 Leiomyoma of uterus, unspecified: Secondary | ICD-10-CM | POA: Diagnosis not present

## 2023-10-30 LAB — CBC
HCT: 32.8 % — ABNORMAL LOW (ref 36.0–46.0)
Hemoglobin: 10.6 g/dL — ABNORMAL LOW (ref 12.0–15.0)
MCH: 27.5 pg (ref 26.0–34.0)
MCHC: 32.3 g/dL (ref 30.0–36.0)
MCV: 85 fL (ref 80.0–100.0)
Platelets: 100 K/uL — ABNORMAL LOW (ref 150–400)
RBC: 3.86 MIL/uL — ABNORMAL LOW (ref 3.87–5.11)
RDW: 14.4 % (ref 11.5–15.5)
WBC: 6.2 K/uL (ref 4.0–10.5)
nRBC: 0 % (ref 0.0–0.2)

## 2023-10-30 LAB — BASIC METABOLIC PANEL WITH GFR
Anion gap: 6 (ref 5–15)
BUN: 19 mg/dL (ref 8–23)
CO2: 23 mmol/L (ref 22–32)
Calcium: 8 mg/dL — ABNORMAL LOW (ref 8.9–10.3)
Chloride: 106 mmol/L (ref 98–111)
Creatinine, Ser: 0.7 mg/dL (ref 0.44–1.00)
GFR, Estimated: >60 mL/min (ref >60)
Glucose, Bld: 169 mg/dL — ABNORMAL HIGH (ref 70–99)
Potassium: 4.1 mmol/L (ref 3.5–5.1)
Sodium: 135 mmol/L (ref 135–145)

## 2023-10-30 LAB — GLUCOSE, CAPILLARY: Glucose-Capillary: 137 mg/dL — ABNORMAL HIGH (ref 70–99)

## 2023-10-30 MED ORDER — FERROUS SULFATE 325 (65 FE) MG PO TABS
325.0000 mg | ORAL_TABLET | ORAL | 0 refills | Status: AC
Start: 2023-10-30 — End: 2024-01-28

## 2023-10-30 NOTE — Discharge Summary (Signed)
 GYN Discharge Summary    Patient Name: Courtney Sparks   DOB:11/15/1950 MEDICAL RECORD [id number]   Date of Admission: 10/29/2023 Date of Discharge:  10/30/2023   Admitting Diagnosis: Postmenopausal bleeding [N95.0] Endometrial hyperplasia without atypia [N85.00] S/P total abdominal hysterectomy and bilateral salpingo-oophorectomy [Z90.710, Z90.722, Z90.79] Discharge Diagnosis: S/p RA TLH BSO, LOA, placement of JP drain, cystoscopy, and repair of cystotomy.    Condition on Discharge: Good    Brief Hospital Course:  Courtney Sparks is a 73 y.o. woman admitted for scheduled surgery. On 10/30/23 she underwent a RA TLH BSO, LOA, placement of JP drain, cystoscopy, and repair of cystotomy for endometrial hyperplasia; surgery complicated by a cystotomy requiring repair. Reference operative report for further details of surgery. Following acute recovery in the PACU she was admitted to the for post-operative care. She ambulated without difficulty. Diet was advanced as tolerated. Minimal pain. JP drain put out 420cc in 24 hours and UOP was 945cc in 24 hours. She was discharged to home on POD1 in good condition, pain well controlled on oral meds, tolerating a solid diet. Foley and JP drain in place. She was discharged home with 1 week of prophylactic Lovenox .    Operation:  10/30/23- RA TLH BSO, LOA, placement of JP drain, cystoscopy, and repair of cystotomy.     Discharge Medications:  Allergies as of 10/30/2023   No Known Allergies      Medication List     TAKE these medications    aspirin EC 81 MG tablet Take 81 mg by mouth daily. Swallow whole.   atorvastatin  10 MG tablet Commonly known as: LIPITOR Take 10 mg by mouth at bedtime.   CALCIUM  + VITAMIN D3 PO Take 1 tablet by mouth daily.   enoxaparin  40 MG/0.4ML injection Commonly known as: LOVENOX  Inject 0.4 mLs (40 mg total) into the skin daily for 7 days.   ferrous sulfate  325 (65 FE) MG tablet Take 1 tablet (325 mg total) by mouth every  Monday, Wednesday, and Friday.   lisinopril  20 MG tablet Commonly known as: ZESTRIL  Take 20 mg by mouth 2 (two) times daily.   metFORMIN 500 MG tablet Commonly known as: GLUCOPHAGE Take 1,000 mg by mouth 2 (two) times daily with a meal.   MULTIVITAMIN ADULT PO Take 1 tablet by mouth daily.               Discharge Care Instructions  (From admission, onward)           Start     Ordered   10/29/23 0000  Discharge wound care:       Comments: Remove dressing in 5 days or if significantly soiled. Keep incisions dry.   10/29/23 1028              Discharge Activities:  No heavy lifting (20 lbs) x 4 weeks. Pelvic rest x 8 weeks. No driving while on narcotic pain medication.    Discharge Diet:  Regular diet    Discharge Instructions:  Take all medications as directed and keep all follow-up appointments. Seek care immediately should you experience fever, inability to keep down food or liquids, increasing pain, vaginal bleeding.    Discharge Follow-Up:  Follow-up with Dr. Georganne on 9/8 for drain follow-up Follow up at post operative visit in approximately 2 weeks.    Beverli Dinsmore, MD, 10/30/2023

## 2023-10-30 NOTE — Care Management Obs Status (Signed)
 MEDICARE OBSERVATION STATUS NOTIFICATION   Patient Details  Name: Courtney Sparks MRN: [id number] Date of Birth: [birth year]   Medicare Observation Status Notification Given:  Yes    Rojelio SHAUNNA Rattler 10/30/2023, 10:45 AM

## 2023-10-30 NOTE — Progress Notes (Signed)
 GYN POST OP PROGRESS NOTE Courtney Sparks [id number] 10/30/2023  Subjective   Doing well. Slept the whole night and feeling good. Pain is well controlled. Tolerating PO without n/v.  Denies CP, SOB, calf pain, fevers.     Objective   Vitals:   10/29/23 1738 10/29/23 1933 10/29/23 2205 10/30/23 0317  BP: (!) 144/72 135/74 (!) 142/86 (!) 109/51  Pulse: 89 90 94 80  Resp: 18 20 20 20   Temp:  97.8 F (36.6 C) 97.7 F (36.5 C) 98.1 F (36.7 C)  TempSrc:  Oral Oral Oral  SpO2: 96% 95% 95% 100%  Weight:      Height:       I/O last 3 completed shifts: In: 1240 [P.O.:90; I.V.:700; IV Piggyback:450] Out: 1440 [Urine:945; Drains:420; Blood:75] No intake/output data recorded.   Gen: NAD, A&O x 3 CV: Regular rate Lungs: Non-labored breathing Abd: Soft, non-tender, nondistended Ext: warm, SCDs on, no BLE edema Incision: c/d/i, 2x2 and tegaderm in place with some dried blood   Drains: Foley in place- yellow urine in bag Jp drain in place- serosanginous fluid in bulb   Labs: Recent Labs    10/27/23 1304 10/30/23 0520  WBC 6.4 6.2  HGB 12.6 10.6*  HCT 37.8 32.8*  MCV 84.0 85.0  PLT 118* 100*   Recent Labs    10/27/23 1304 10/30/23 0520  NA 139 135  K 3.8 4.1  CL 106 106  CO2 21* 23  CREATININE 0.45 0.70  BUN 12 19  CALCIUM  9.3 8.0*  ALT 36  --   AST 49*  --    No results found for: MG    Assessment & Plan  Courtney Sparks is a 73 y.o. F POD1 from a RA TLH BSO, LOA, placement of JP drain, cystoscopy, and repair of cystotomy.    - FEN: Regular diet. - Pain: ERAS protocol. Scheduled Tylenol  and ibuprofen . PRN Oxycodone . - Heme: Hb 12.6 pre-op > 10.6. PO ferrous sulfate  325 MWF.   - CV: VSS - HTN: Home lisinopril  ordered.  - HLD: home atorvastatin  continued - Resp: encourage incentive spirometry - GI: Scheduled bowel regimen, antiemetics PRN. JP drain in place with bulb to suction.  - GU: foley to remain in place- adequate output. Patient will follow-up with Dr.  Georganne on 9/8.  - Endo: T2DM- metformin held, SSI PRN with TID AC/HS glucose checks - Renal: Cr 0.45 pre-op > 0.7 - Ppx: PPI, encourage ambulation, SCD's, lovenox  40 mg to begin in AM   Disposition: discharge home. Scheduled for f/u with Dr. Georganne next week.    Electronically signed by:  Beverli LULLA Dinsmore, MD, 10/30/2023, 7:49 AM

## 2023-10-30 NOTE — Progress Notes (Signed)
 Patient discharged home. Discharge instructions, teaching, and prescriptions given and reviewed with patient. Pt verbalized understanding. Patient verbalized understanding. Escorted out by Agricultural consultant.

## 2023-10-30 NOTE — Anesthesia Postprocedure Evaluation (Signed)
 Anesthesia Post Note  Patient: Courtney Sparks  Procedure(s) Performed: HYSTERECTOMY, TOTAL, ROBOT-ASSISTED, LAPAROSCOPIC, WITH BILATERAL SALPINGO-OOPHORECTOMY (Bilateral: Uterus) CYSTOSCOPY (Bladder) LYSIS, ADHESIONS, ROBOT-ASSISTED, LAPAROSCOPIC (Pelvis) ROBOTIC ASSISTED LAPAROSCOPIC CYSTORRHAPHY (Bladder)  Patient location during evaluation: PACU Anesthesia Type: General Level of consciousness: awake and alert Pain management: pain level controlled Vital Signs Assessment: post-procedure vital signs reviewed and stable Respiratory status: spontaneous breathing, nonlabored ventilation, respiratory function stable and patient connected to nasal cannula oxygen Cardiovascular status: blood pressure returned to baseline and stable Postop Assessment: no apparent nausea or vomiting Anesthetic complications: no   No notable events documented.   Last Vitals:  Vitals:   10/29/23 2205 10/30/23 0317  BP: (!) 142/86 (!) 109/51  Pulse: 94 80  Resp: 20 20  Temp: 36.5 C 36.7 C  SpO2: 95% 100%    Last Pain:  Vitals:   10/30/23 0425  TempSrc:   PainSc: 0-No pain                 Lendia LITTIE Mae

## 2023-11-01 NOTE — Assessment & Plan Note (Signed)
 small Left dome cystotomy during robotic TAH S/p intra-op repair (10/29/23)  JP drain removed in clinic today  - continue indwelling Foley thru POD7 - RTC on 11/06/23 for catheter removal /TOV   - single dose PO Levaquin  at removal

## 2023-11-01 NOTE — Progress Notes (Unsigned)
   11/02/2023 8:09 AM   Courtney Sparks May 21, 1950 [id number]  Reason for visit: Follow up JP drain removal   HPI: Intra-op consult from 10/29/23 Small cystotomy during robotic TAH s/p repair JP drain placed, 13F foley indwelling  POD4 today.  Overall has been doing well at home, tolerating regular diet, passing gas with bowel movements.  No vaginal complaints, bleeding. she does mention some new nausea without vomiting this morning  JP drain outputs-patient logged 100-200 cc daily  Physical Exam: BP (!) 146/81 (BP Location: Left Arm, Patient Position: Sitting, Cuff Size: Large)   Pulse 67   Ht 5' 9 (1.753 m)   Wt 227 lb (103 kg)   BMI 33.52 kg/m    Constitutional:  Alert and oriented, No acute distress.  Abd: soft, nondistended, nontender. JP drain RLQ with serous fluid. Remainder of robotic port site uncovered from dressing, c/d/I underneath GU: 16 French two-way catheter in place, amber-yellow urine in tubing  Laboratory Data:  Latest Reference Range & Units 10/30/23 05:20  Creatinine 0.44 - 1.00 mg/dL 9.29    - POD1 at discharge  Pertinent Imaging: N/A   Assessment & Plan:    History of cystostomy Assessment & Plan: small Left dome cystotomy during robotic TAH S/p intra-op repair (10/29/23)  JP drain removed in clinic today  - continue indwelling Foley thru POD7 - RTC on 11/06/23 for catheter removal /TOV   - single dose PO Levaquin  at removal        Courtney JONELLE Skye, MD  [facility] [address] [place], [place] [address] [phone]

## 2023-11-02 ENCOUNTER — Encounter: Payer: Self-pay | Admitting: Urology

## 2023-11-02 ENCOUNTER — Ambulatory Visit (INDEPENDENT_AMBULATORY_CARE_PROVIDER_SITE_OTHER): Admitting: Urology

## 2023-11-02 VITALS — BP 146/81 | HR 67 | Ht 69.0 in | Wt 227.0 lb

## 2023-11-02 DIAGNOSIS — Z9889 Other specified postprocedural states: Secondary | ICD-10-CM

## 2023-11-02 LAB — SURGICAL PATHOLOGY

## 2023-11-04 NOTE — Assessment & Plan Note (Signed)
 small Left dome cystotomy during robotic TAH S/p intra-op repair (10/29/23)  Indwelling Foley removed today in clinic Peri-pull PO Levaquin  administered   - Okay to follow up within 6-8 weeks for symptom check-- if no issues, okay for PRN follow up

## 2023-11-04 NOTE — Progress Notes (Unsigned)
   11/06/2023 8:22 AM   Courtney Sparks [birth year] [id number]  Reason for visit: Follow up cystotomy repair   HPI:  11/04/23: POD8, here for catheter removal.  She is doing very well this morning.  Fully recovered, no nausea which was present earlier in the week.   Intra-op consult from 10/29/23 Small cystotomy during robotic TAH s/p repair JP drain placed, 51F foley indwelling  POD4 today.  Overall has been doing well at home, tolerating regular diet, passing gas with bowel movements.  No vaginal complaints, bleeding. she does mention some new nausea without vomiting this morning  JP drain outputs-patient logged 100-200 cc daily  Physical Exam: BP 124/76 (BP Location: Left Arm, Patient Position: Sitting, Cuff Size: Large)   Pulse 89   Ht 5' 9 (1.753 m)   Wt 225 lb (102.1 kg)   BMI 33.23 kg/m    Constitutional:  Alert and oriented, No acute distress.  Abd: soft, nondistended, nontender. JP drain RLQ with serous fluid. Remainder of robotic port site uncovered from dressing, c/d/I underneath GU: 16 French two-way catheter in place, amber-yellow urine in tubing  Laboratory Data:  Latest Reference Range & Units 10/30/23 05:20  Creatinine 0.44 - 1.00 mg/dL 9.29    - POD1 at discharge  Pertinent Imaging: N/A   Assessment & Plan:    History of cystostomy Assessment & Plan: small Left dome cystotomy during robotic TAH S/p intra-op repair (10/29/23)  Indwelling Foley removed today in clinic Peri-pull PO Levaquin  administered   - Okay to follow up within 6-8 weeks for symptom check-- if no issues, okay for PRN follow up  Orders: -     [id number]         [name], MD  [facility] [address], [address] [place], [place] [address] [phone]

## 2023-11-06 ENCOUNTER — Ambulatory Visit (INDEPENDENT_AMBULATORY_CARE_PROVIDER_SITE_OTHER): Admitting: Urology

## 2023-11-06 ENCOUNTER — Encounter: Payer: Self-pay | Admitting: Urology

## 2023-11-06 VITALS — BP 124/76 | HR 89 | Ht 69.0 in | Wt 225.0 lb

## 2023-11-06 DIAGNOSIS — Z9889 Other specified postprocedural states: Secondary | ICD-10-CM

## 2023-11-06 MED ORDER — LEVOFLOXACIN 500 MG PO TABS
500.0000 mg | ORAL_TABLET | Freq: Once | ORAL | Status: AC
  Administered 2023-11-06: 500 mg via ORAL

## 2023-11-06 NOTE — Progress Notes (Signed)
 Catheter Removal  Patient is present today for a catheter removal.  10ml of water  was drained from the balloon. A 16FR foley cath was removed from the bladder, no complications were noted. Patient tolerated well.  Performed by: Myrtha Dante LATHER  Follow up/ Additional notes: Patient is to follow up as needed.

## 2023-11-10 ENCOUNTER — Other Ambulatory Visit: Payer: Self-pay | Admitting: Gastroenterology

## 2023-11-10 DIAGNOSIS — K7460 Unspecified cirrhosis of liver: Secondary | ICD-10-CM

## 2023-11-19 ENCOUNTER — Ambulatory Visit: Payer: Self-pay

## 2023-11-19 DIAGNOSIS — I8500 Esophageal varices without bleeding: Secondary | ICD-10-CM | POA: Diagnosis not present

## 2023-11-19 DIAGNOSIS — K7469 Other cirrhosis of liver: Secondary | ICD-10-CM | POA: Diagnosis present

## 2023-11-19 DIAGNOSIS — K766 Portal hypertension: Secondary | ICD-10-CM | POA: Diagnosis not present
